# Patient Record
Sex: Male | Born: 1965 | Race: White | Hispanic: No | Marital: Single | Smoking: Never smoker
Health system: Southern US, Community
[De-identification: ages and names within clinical notes are randomized; demographics above are authoritative.]

## PROBLEM LIST (undated history)

## (undated) DIAGNOSIS — H547 Unspecified visual loss: Secondary | ICD-10-CM

## (undated) DIAGNOSIS — H9190 Unspecified hearing loss, unspecified ear: Secondary | ICD-10-CM

## (undated) DIAGNOSIS — T148XXA Other injury of unspecified body region, initial encounter: Secondary | ICD-10-CM

## (undated) DIAGNOSIS — S069XAA Unspecified intracranial injury with loss of consciousness status unknown, initial encounter: Secondary | ICD-10-CM

## (undated) DIAGNOSIS — S069X9A Unspecified intracranial injury with loss of consciousness of unspecified duration, initial encounter: Secondary | ICD-10-CM

## (undated) HISTORY — PX: VASECTOMY: SHX75

## (undated) HISTORY — PX: JOINT REPLACEMENT: SHX530

## (undated) HISTORY — PX: APPENDECTOMY: SHX54

## (undated) HISTORY — PX: FRACTURE SURGERY: SHX138

---

## 1999-06-25 ENCOUNTER — Emergency Department (HOSPITAL_COMMUNITY): Admission: EM | Admit: 1999-06-25 | Discharge: 1999-06-25 | Payer: Self-pay | Admitting: Emergency Medicine

## 1999-06-25 ENCOUNTER — Encounter: Payer: Self-pay | Admitting: Emergency Medicine

## 1999-06-28 ENCOUNTER — Observation Stay (HOSPITAL_COMMUNITY): Admission: RE | Admit: 1999-06-28 | Discharge: 1999-06-29 | Payer: Self-pay | Admitting: Orthopedic Surgery

## 1999-06-28 ENCOUNTER — Encounter: Payer: Self-pay | Admitting: Orthopedic Surgery

## 1999-11-15 ENCOUNTER — Ambulatory Visit (HOSPITAL_COMMUNITY): Admission: RE | Admit: 1999-11-15 | Discharge: 1999-11-15 | Payer: Self-pay | Admitting: Orthopedic Surgery

## 2013-02-03 ENCOUNTER — Telehealth: Payer: Self-pay | Admitting: Radiology

## 2013-02-03 ENCOUNTER — Ambulatory Visit: Payer: BC Managed Care – PPO

## 2013-02-03 ENCOUNTER — Ambulatory Visit (INDEPENDENT_AMBULATORY_CARE_PROVIDER_SITE_OTHER): Payer: BC Managed Care – PPO | Admitting: Family Medicine

## 2013-02-03 VITALS — BP 116/86 | HR 96 | Temp 98.1°F | Resp 20 | Ht 68.5 in | Wt 225.0 lb

## 2013-02-03 DIAGNOSIS — J189 Pneumonia, unspecified organism: Secondary | ICD-10-CM

## 2013-02-03 DIAGNOSIS — R05 Cough: Secondary | ICD-10-CM

## 2013-02-03 DIAGNOSIS — R059 Cough, unspecified: Secondary | ICD-10-CM

## 2013-02-03 LAB — COMPREHENSIVE METABOLIC PANEL
ALT: 36 U/L (ref 0–53)
AST: 41 U/L — ABNORMAL HIGH (ref 0–37)
Albumin: 3.7 g/dL (ref 3.5–5.2)
Alkaline Phosphatase: 66 U/L (ref 39–117)
BUN: 8 mg/dL (ref 6–23)
CO2: 26 mEq/L (ref 19–32)
Calcium: 8.7 mg/dL (ref 8.4–10.5)
Chloride: 104 mEq/L (ref 96–112)
Creat: 0.92 mg/dL (ref 0.50–1.35)
Glucose, Bld: 84 mg/dL (ref 70–99)
Potassium: 3.8 mEq/L (ref 3.5–5.3)
Sodium: 140 mEq/L (ref 135–145)
Total Bilirubin: 0.4 mg/dL (ref 0.3–1.2)
Total Protein: 6.7 g/dL (ref 6.0–8.3)

## 2013-02-03 LAB — POCT CBC
Granulocyte percent: 64.5 %G (ref 37–80)
HCT, POC: 46.6 % (ref 43.5–53.7)
Hemoglobin: 14.8 g/dL (ref 14.1–18.1)
Lymph, poc: 2.1 (ref 0.6–3.4)
MCH, POC: 27.9 pg (ref 27–31.2)
MCHC: 31.8 g/dL (ref 31.8–35.4)
MCV: 87.8 fL (ref 80–97)
MID (cbc): 0.8 (ref 0–0.9)
MPV: 9.4 fL (ref 0–99.8)
POC Granulocyte: 5.2 (ref 2–6.9)
POC LYMPH PERCENT: 26 %L (ref 10–50)
POC MID %: 9.5 %M (ref 0–12)
Platelet Count, POC: 206 10*3/uL (ref 142–424)
RBC: 5.31 M/uL (ref 4.69–6.13)
RDW, POC: 14.1 %
WBC: 8.1 10*3/uL (ref 4.6–10.2)

## 2013-02-03 MED ORDER — LEVOFLOXACIN 500 MG PO TABS: 500.0000 mg | ORAL_TABLET | Freq: Every day | ORAL | Status: DC

## 2013-02-03 MED ORDER — PHENYLEPH-PROMETHAZINE-COD 5-6.25-10 MG/5ML PO SYRP: 5.0000 mL | ORAL_SOLUTION | Freq: Four times a day (QID) | ORAL | Status: DC | PRN

## 2013-02-03 NOTE — Patient Instructions (Addendum)

## 2013-02-03 NOTE — Progress Notes (Signed)
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  Horrible cough with chest pain.  Nonproductive.  Associated night sweats.  Nonsmoker, no asthma.  Exposed to second hand smoke with wife, who is also sick  Objective:  Acutely ill but not dyspneic.  Violent painful cough Chest:  Bibasilar rales with some expiratory wheezes HEENT: mild erythematous pharynx and slight erythematous TM's Neck:  Supple, no adenop Heart: regular about 100 bpm, no murmur Extremities:  No edema [facility] reading (PRIMARY) by  Dr. Milus Glazier.  CXR  Left air bronchogram suggestive of infiltrate Results for orders placed in visit on 02/03/13  POCT CBC      Result Value Range   WBC 8.1  4.6 - 10.2 K/uL   Lymph, poc 2.1  0.6 - 3.4   POC LYMPH PERCENT 26.0  10 - 50 %L   MID (cbc) 0.8  0 - 0.9   POC MID % 9.5  0 - 12 %M   POC Granulocyte 5.2  2 - 6.9   Granulocyte percent 64.5  37 - 80 %G   RBC 5.31  4.69 - 6.13 M/uL   Hemoglobin 14.8  14.1 - 18.1 g/dL   HCT, POC 82.9  56.2 - 53.7 %   MCV 87.8  80 - 97 fL   MCH, POC 27.9  27 - 31.2 pg   MCHC 31.8  31.8 - 35.4 g/dL   RDW, POC 13.0     Platelet Count, POC 206  142 - 424 K/uL   MPV 9.4  0 - 99.8 fL     Assessment  Pneumonia, LLL  Plan   Pneumonia - Plan: levofloxacin (LEVAQUIN) 500 MG tablet, Phenyleph-Promethazine-Cod 5-6.25-10 MG/5ML SYRP  Cough - Plan: POCT CBC, Comprehensive metabolic panel, DG Chest 2 View  Subjective:    Austin Zavala is a 47 y.o. male who presents for  evaluation of cough. Symptoms include: anorexia. Symptoms began 5 days ago, and have been gradually worsening since that time. Patient denies dyspnea. Treatment thus far includes: anti-tussive: somewhat effective. Past pulmonary history is significant for no history of pneumonia or bronchitis.  The following portions of the patient's history were reviewed and updated as appropriate: allergies, past family history, past medical  history, past social history, past surgical history and problem list.  Review of Systems Pertinent items are noted in HPI.   Objective:     BP 116/86  Pulse 96  Temp(Src) 98.1 F (36.7 C) (Oral)  Resp 20  Ht 5' 8.5" (1.74 m)  Wt 225 lb (102.059 kg)  BMI 33.71 kg/m2  SpO2 92%  General Appearance:    Alert, cooperative, no distress, appears stated age  Head:    Normocephalic, without obvious abnormality, atraumatic  Eyes:    PERRL, conjunctiva/corneas clear, EOM's intact, fundi    benign, both eyes       Ears:    Normal TM's and external ear canals, both ears  Nose:   Nares normal, septum midline, mucosa normal, no drainage    or sinus tenderness  Throat:   Lips, mucosa, and tongue normal; teeth and gums normal  Neck:   Supple, symmetrical, trachea midline, no adenopathy;       thyroid:  No enlargement/tenderness/nodules; no carotid   bruit or JVD  Back:     Symmetric, no curvature, ROM normal, no CVA tenderness  Lungs:     Bilateral ronchi, rales and some exp wheezes  Chest wall:  No tenderness or deformity  Heart:    Regular rate and rhythm, S1 and S2 normal, no murmur, rub   or gallop  Abdomen:     Soft, non-tender, bowel sounds active all four quadrants,    no masses, no organomegaly  Genitalia:    Rectal:    Extremities:   Extremities normal, atraumatic, no cyanosis or edema  Pulses:   2+ and symmetric all extremities  Skin:   Skin color, texture, turgor normal, no rashes or lesions  Lymph nodes:   Cervical, supraclavicular, and axillary nodes normal  Neurologic:   CNII-XII intact. Normal strength, sensation and reflexes      throughout     Assessment:    Pneumonia   Plan:    The diagnosis was discussed along with the usual course and treatment.

## 2013-02-03 NOTE — Telephone Encounter (Signed)
Call report from radiology, Chest Xray negative, no sign of pneumonia, to you FYI.

## 2015-03-16 ENCOUNTER — Ambulatory Visit (INDEPENDENT_AMBULATORY_CARE_PROVIDER_SITE_OTHER): Payer: BC Managed Care – PPO | Admitting: Urgent Care

## 2015-03-16 VITALS — BP 128/78 | HR 112 | Temp 99.9°F | Resp 20 | Ht 68.5 in | Wt 246.5 lb

## 2015-03-16 DIAGNOSIS — L03113 Cellulitis of right upper limb: Secondary | ICD-10-CM

## 2015-03-16 DIAGNOSIS — I891 Lymphangitis: Secondary | ICD-10-CM | POA: Diagnosis not present

## 2015-03-16 LAB — POCT CBC
Granulocyte percent: 85.2 %G — AB (ref 37–80)
HCT, POC: 43.5 % (ref 43.5–53.7)
Hemoglobin: 14.2 g/dL (ref 14.1–18.1)
Lymph, poc: 1.9 (ref 0.6–3.4)
MCH, POC: 28.5 pg (ref 27–31.2)
MCHC: 32.7 g/dL (ref 31.8–35.4)
MCV: 87.1 fL (ref 80–97)
MID (cbc): 0.9 (ref 0–0.9)
MPV: 8.4 fL (ref 0–99.8)
POC Granulocyte: 15.7 — AB (ref 2–6.9)
POC LYMPH PERCENT: 10.1 %L (ref 10–50)
POC MID %: 4.7 %M (ref 0–12)
Platelet Count, POC: 228 10*3/uL (ref 142–424)
RBC: 4.99 M/uL (ref 4.69–6.13)
RDW, POC: 15 %
WBC: 18.4 10*3/uL — AB (ref 4.6–10.2)

## 2015-03-16 MED ORDER — CEFTRIAXONE SODIUM 1 G IJ SOLR
1.0000 g | Freq: Once | INTRAMUSCULAR | Status: AC
  Administered 2015-03-16: 1 g via INTRAMUSCULAR

## 2015-03-16 MED ORDER — DOXYCYCLINE HYCLATE 100 MG PO CAPS: 100.0000 mg | ORAL_CAPSULE | Freq: Two times a day (BID) | ORAL | Status: DC

## 2015-03-16 NOTE — Progress Notes (Signed)
MRN: [id number] DOB: 04/27/1966  Subjective:   Austin Zavala is a 49 y.o. male presenting for chief complaint of Arm Swelling  Reports 2 day history of right forearm infection. Patient reports that initially this problem started out as a pimple-like lesion on his right forearm. He states that he picked at it and got it to open up, it drained some clear liquid and sometimes pus it was very minimal. Today, he woke up and felt pain over his right forearm, saw that it was red and somewhat swollen, also started to have subjective fever. Has not tried any medications for relief. Denies numbness, tingling, decreased range of motion, decreased sensation, nausea, vomiting, abdominal pain. Of note, patient reports that he has previously had cellulitis of his left arm that spread into his axilla. This occurred back in 2003, patient states that he took antibiotics for a while and eventually his infection resolved. He denies previous infection with MRSA. Denies any other aggravating or relieving factors, no other questions or concerns.  Chrissie NoaWilliam has a current medication list which includes the following prescription(s): phenyleph-promethazine-cod. He has No Known Allergies.  Chrissie NoaWilliam  has no past medical history on file. Also  has past surgical history that includes Appendectomy; Fracture surgery; Vasectomy; and Joint replacement.  ROS As in subjective.  Objective:   Vitals: BP 128/78 mmHg  Pulse 112  Temp(Src) 99.9 F (37.7 C) (Oral)  Resp 20  Ht 5' 8.5" (1.74 m)  Wt 246 lb 8 oz (111.812 kg)  BMI 36.93 kg/m2  SpO2 96%  Physical Exam  Constitutional: He is oriented to person, place, and time. He appears well-developed and well-nourished.  Cardiovascular:  Tachycardic.  Pulmonary/Chest: Effort normal.  Musculoskeletal: Normal range of motion.       Right elbow: He exhibits swelling. He exhibits normal range of motion, no effusion, no deformity and no laceration. Tenderness found.        Arms: Neurological: He is alert and oriented to person, place, and time. He has normal reflexes.  Skin: Skin is warm and dry. No rash noted. There is erythema (as depicted). No pallor.    Results for orders placed or performed in visit on 03/16/15 (from the past 24 hour(s))  POCT CBC     Status: Abnormal   Collection Time: 03/16/15  8:02 PM  Result Value Ref Range   WBC 18.4 (A) 4.6 - 10.2 K/uL   Lymph, poc 1.9 0.6 - 3.4   POC LYMPH PERCENT 10.1 10 - 50 %L   MID (cbc) 0.9 0 - 0.9   POC MID % 4.7 0 - 12 %M   POC Granulocyte 15.7 (A) 2 - 6.9   Granulocyte percent 85.2 (A) 37 - 80 %G   RBC 4.99 4.69 - 6.13 M/uL   Hemoglobin 14.2 14.1 - 18.1 g/dL   HCT, POC 27.743.5 82.443.5 - 53.7 %   MCV 87.1 80 - 97 fL   MCH, POC 28.5 27 - 31.2 pg   MCHC 32.7 31.8 - 35.4 g/dL   RDW, POC 23.515.0 %   Platelet Count, POC 228 142 - 424 K/uL   MPV 8.4 0 - 99.8 fL   Assessment and Plan :   1. Cellulitis of right upper extremity 2. Lymphangitis - IM Rocephin provided in clinic today, start doxycycline x10 days. Counseled patient on worsening signs of infection, outlined the area of cellulitis and advised patient to return to clinic on 03/18/2015 if there has been no improvement despite antibiotic treatment and/or systemic  signs of infection develop.  Wallis BambergMario Oseas Detty, PA-C [facility] [phone] 03/16/2015 7:28 PM

## 2015-03-16 NOTE — Patient Instructions (Signed)

## 2015-03-18 ENCOUNTER — Encounter: Payer: Self-pay | Admitting: Family Medicine

## 2015-03-18 ENCOUNTER — Ambulatory Visit (INDEPENDENT_AMBULATORY_CARE_PROVIDER_SITE_OTHER): Payer: BC Managed Care – PPO | Admitting: Family Medicine

## 2015-03-18 VITALS — BP 122/88 | HR 120 | Temp 98.1°F | Resp 16 | Ht 68.5 in | Wt 235.0 lb

## 2015-03-18 DIAGNOSIS — L03113 Cellulitis of right upper limb: Secondary | ICD-10-CM | POA: Diagnosis not present

## 2015-03-18 DIAGNOSIS — L02413 Cutaneous abscess of right upper limb: Secondary | ICD-10-CM | POA: Diagnosis not present

## 2015-03-18 LAB — POCT CBC
Granulocyte percent: 81.6 %G — AB (ref 37–80)
HEMATOCRIT: 45.7 % (ref 43.5–53.7)
Hemoglobin: 14.2 g/dL (ref 14.1–18.1)
LYMPH, POC: 2.1 (ref 0.6–3.4)
MCH: 27.6 pg (ref 27–31.2)
MCHC: 31.1 g/dL — AB (ref 31.8–35.4)
MCV: 88.9 fL (ref 80–97)
MID (CBC): 0.7 (ref 0–0.9)
MPV: 8.7 fL (ref 0–99.8)
PLATELET COUNT, POC: 229 10*3/uL (ref 142–424)
POC GRANULOCYTE: 12.4 — AB (ref 2–6.9)
POC LYMPH PERCENT: 14 %L (ref 10–50)
POC MID %: 4.4 %M (ref 0–12)
RBC: 5.13 M/uL (ref 4.69–6.13)
RDW, POC: 15.5 %
WBC: 15.2 10*3/uL — AB (ref 4.6–10.2)

## 2015-03-18 NOTE — Patient Instructions (Signed)
Warm compresses 4-5 times per day. Continue doxycycline for now. Follow up Sunday for recheck.  Return to the clinic or go to the nearest emergency room if any of your symptoms worsen or new symptoms occur.   Abscess An abscess is an infected area that contains a collection of pus and debris.It can occur in almost any part of the body. An abscess is also known as a furuncle or boil. CAUSES  An abscess occurs when tissue gets infected. This can occur from blockage of oil or sweat glands, infection of hair follicles, or a minor injury to the skin. As the body tries to fight the infection, pus collects in the area and creates pressure under the skin. This pressure causes pain. People with weakened immune systems have difficulty fighting infections and get certain abscesses more often.  SYMPTOMS Usually an abscess develops on the skin and becomes a painful mass that is red, warm, and tender. If the abscess forms under the skin, you may feel a moveable soft area under the skin. Some abscesses break open (rupture) on their own, but most will continue to get worse without care. The infection can spread deeper into the body and eventually into the bloodstream, causing you to feel ill.  DIAGNOSIS  Your caregiver will take your medical history and perform a physical exam. A sample of fluid may also be taken from the abscess to determine what is causing your infection. TREATMENT  Your caregiver may prescribe antibiotic medicines to fight the infection. However, taking antibiotics alone usually does not cure an abscess. Your caregiver may need to make a small cut (incision) in the abscess to drain the pus. In some cases, gauze is packed into the abscess to reduce pain and to continue draining the area. HOME CARE INSTRUCTIONS   Only take over-the-counter or prescription medicines for pain, discomfort, or fever as directed by your caregiver.  If you were prescribed antibiotics, take them as directed. Finish  them even if you start to feel better.  If gauze is used, follow your caregiver's directions for changing the gauze.  To avoid spreading the infection:  Keep your draining abscess covered with a bandage.  Wash your hands well.  Do not share personal care items, towels, or whirlpools with others.  Avoid skin contact with others.  Keep your skin and clothes clean around the abscess.  Keep all follow-up appointments as directed by your caregiver. SEEK MEDICAL CARE IF:   You have increased pain, swelling, redness, fluid drainage, or bleeding.  You have muscle aches, chills, or a general ill feeling.  You have a fever. MAKE SURE YOU:   Understand these instructions.  Will watch your condition.  Will get help right away if you are not doing well or get worse. Document Released: 08/01/2005 Document Revised: 04/22/2012 Document Reviewed: 01/04/2012 [facility] Patient Information 2015 [place], [place]. This information is not intended to replace advice given to you by your health care provider. Make sure you discuss any questions you have with your health care provider.

## 2015-03-18 NOTE — Progress Notes (Signed)
Procedure:  Consent obtained.  Local anesthesia with 1% lido.  Skin cleaned and #11 blade used to make a cm incision into the fluctuance of the abscess.  Purulent material expressed and sent for wound culture.  Wound cavity probed and 1/4 in plain packing placed.  Drsg applied.  Wound care d/w pt and he will RTC in 48h for a recheck.

## 2015-03-18 NOTE — Progress Notes (Addendum)
Subjective:  This chart was scribed for Meredith Staggers, MD by Elveria Rising, Medial Scribe. This patient was seen in room 13 and the patient's care was started at 8:24 AM.    Patient ID: Austin Zavala, male    DOB: Aug 19, 1966, 49 y.o.   MRN: [id number] Chief Complaint  Patient presents with  . Wound Check    Getting worse    HPI HPI Comments: Austin Zavala is a 49 y.o. male who [facility] the [facility] requesting re evaluation of cellulitis to right posterior forearm; initial encounter 5/11. Patient seen by Wallis Bamberg. Infection initially started as pimple like lesion on forearm. Outlined area noted indicated cellulitis limited to right forearm. At visit, patient given IM Rocephin, 1g and started on Doxycycline,  BID.  Today patient reports improvement of his erythema, but reports development of central firming and "knotting up" to his wound. Patient denies fever, but reports cold chills at night. Patient also reports nocturia and urinary frequency; he denies personal history of Diabetes. Patient reports treatment with warm compresses daily and denies complications with his antibiotic.   There are no active problems to display for this patient.  No past medical history on file. Past Surgical History  Procedure Laterality Date  . Appendectomy    . Fracture surgery    . Vasectomy    . Joint replacement     No Known Allergies Prior to Admission medications   Medication Sig Start Date End Date Taking? Authorizing Provider  doxycycline (VIBRAMYCIN) 100 MG capsule Take 1 capsule (100 mg total) by mouth 2 (two) times daily. 03/16/15   Wallis Bamberg, PA-C   History   Social History  . Marital Status: Single    Spouse Name: N/A  . Number of Children: N/A  . Years of Education: N/A   Occupational History  . Not on file.   Social History Main Topics  . Smoking status: Never Smoker   . Smokeless tobacco: Current User     Comment: Dip  . Alcohol Use: 0.0  oz/week    0 Standard drinks or equivalent per week  . Drug Use: No  . Sexual Activity: Yes    Birth Control/ Protection: Surgical   Other Topics Concern  . Not on file   Social History Narrative      Review of Systems  Constitutional: Positive for chills. Negative for fever.  Genitourinary: Positive for frequency. Negative for dysuria.  Skin: Positive for color change and wound.       Objective:   Physical Exam  Constitutional: He is oriented to person, place, and time. He appears well-developed and well-nourished. No distress.  HENT:  Head: Normocephalic and atraumatic.  Eyes: EOM are normal.  Neck: Neck supple. No tracheal deviation present.  Cardiovascular: Normal rate.   Pulmonary/Chest: Effort normal. No respiratory distress.  Musculoskeletal: Normal range of motion.  Neurological: He is alert and oriented to person, place, and time.  Skin: Skin is warm and dry. There is erythema.  Right forearm: area of induration to dorsal forearm measuring 5 x 9cm with central most aspect slightly fluctuant under scab. There is erythema extending approximately 2cm from previously drawn line on ulnar aspect of infection and extending 3-4cm from the under surface of the forearm.   Psychiatric: He has a normal mood and affect. His behavior is normal.  Nursing note and vitals reviewed.    Filed Vitals:   03/18/15 0818  BP: 122/88  Pulse: 120  Temp: 98.1  F (36.7 C)  Resp: 16  Height: 5' 8.5" (1.74 m)  Weight: 235 lb (106.595 kg)  SpO2: 96%   Results for orders placed or performed in visit on 03/18/15  POCT CBC  Result Value Ref Range   WBC 15.2 (A) 4.6 - 10.2 K/uL   Lymph, poc 2.1 0.6 - 3.4   POC LYMPH PERCENT 14.0 10 - 50 %L   MID (cbc) 0.7 0 - 0.9   POC MID % 4.4 0 - 12 %M   POC Granulocyte 12.4 (A) 2 - 6.9   Granulocyte percent 81.6 (A) 37 - 80 %G   RBC 5.13 4.69 - 6.13 M/uL   Hemoglobin 14.2 14.1 - 18.1 g/dL   HCT, POC 27.245.7 53.643.5 - 53.7 %   MCV 88.9 80 - 97 fL    MCH, POC 27.6 27 - 31.2 pg   MCHC 31.1 (A) 31.8 - 35.4 g/dL   RDW, POC 64.415.5 %   Platelet Count, POC 229 142 - 424 K/uL   MPV 8.7 0 - 99.8 fL       Assessment & Plan:  Austin GingerWilliam Zavala is a 49 y.o. male Cellulitis of arm, right - Plan: POCT CBC, Wound culture  Abscess of forearm, right - Plan: Wound culture    I and D performed today, wound cx obtained. Improved CBC and afebrile. Will continue doxycycline, warm compresses and recheck in 2 days. Sooner if worse - RTC precautions discussed.     No orders of the defined types were placed in this encounter.   Patient Instructions  Warm compresses 4-5 times per day. Continue doxycycline for now. Follow up Sunday for recheck.  Return to the clinic or go to the nearest emergency room if any of your symptoms worsen or new symptoms occur.   Abscess An abscess is an infected area that contains a collection of pus and debris.It can occur in almost any part of the body. An abscess is also known as a furuncle or boil. CAUSES  An abscess occurs when tissue gets infected. This can occur from blockage of oil or sweat glands, infection of hair follicles, or a minor injury to the skin. As the body tries to fight the infection, pus collects in the area and creates pressure under the skin. This pressure causes pain. People with weakened immune systems have difficulty fighting infections and get certain abscesses more often.  SYMPTOMS Usually an abscess develops on the skin and becomes a painful mass that is red, warm, and tender. If the abscess forms under the skin, you may feel a moveable soft area under the skin. Some abscesses break open (rupture) on their own, but most will continue to get worse without care. The infection can spread deeper into the body and eventually into the bloodstream, causing you to feel ill.  DIAGNOSIS  Your caregiver will take your medical history and perform a physical exam. A sample of fluid may also be taken from the abscess  to determine what is causing your infection. TREATMENT  Your caregiver may prescribe antibiotic medicines to fight the infection. However, taking antibiotics alone usually does not cure an abscess. Your caregiver may need to make a small cut (incision) in the abscess to drain the pus. In some cases, gauze is packed into the abscess to reduce pain and to continue draining the area. HOME CARE INSTRUCTIONS   Only take over-the-counter or prescription medicines for pain, discomfort, or fever as directed by your caregiver.  If you were prescribed antibiotics,  take them as directed. Finish them even if you start to feel better.  If gauze is used, follow your caregiver's directions for changing the gauze.  To avoid spreading the infection:  Keep your draining abscess covered with a bandage.  Wash your hands well.  Do not share personal care items, towels, or whirlpools with others.  Avoid skin contact with others.  Keep your skin and clothes clean around the abscess.  Keep all follow-up appointments as directed by your caregiver. SEEK MEDICAL CARE IF:   You have increased pain, swelling, redness, fluid drainage, or bleeding.  You have muscle aches, chills, or a general ill feeling.  You have a fever. MAKE SURE YOU:   Understand these instructions.  Will watch your condition.  Will get help right away if you are not doing well or get worse. Document Released: 08/01/2005 Document Revised: 04/22/2012 Document Reviewed: 01/04/2012 [facility] Patient Information 2015 [place]Care, [place]LL[place]. This information is not intended to replace advice given to you by your health care provider. Make sure you discuss any questions you have with your health care provider.     I personally performed the services described in this documentation, which was scribed in my presence. The recorded information has been reviewed and considered, and addended by me as needed.

## 2015-03-20 ENCOUNTER — Ambulatory Visit (INDEPENDENT_AMBULATORY_CARE_PROVIDER_SITE_OTHER): Payer: BC Managed Care – PPO | Admitting: Physician Assistant

## 2015-03-20 VITALS — BP 148/100 | HR 90 | Temp 98.2°F | Resp 18 | Wt 237.0 lb

## 2015-03-20 DIAGNOSIS — L02413 Cutaneous abscess of right upper limb: Secondary | ICD-10-CM

## 2015-03-20 DIAGNOSIS — L03113 Cellulitis of right upper limb: Secondary | ICD-10-CM

## 2015-03-20 LAB — WOUND CULTURE
GRAM STAIN: NONE SEEN
GRAM STAIN: NONE SEEN

## 2015-03-20 LAB — POCT CBC
Granulocyte percent: 73.6 %G (ref 37–80)
HCT, POC: 43.5 % (ref 43.5–53.7)
Hemoglobin: 14.5 g/dL (ref 14.1–18.1)
Lymph, poc: 2.4 (ref 0.6–3.4)
MCH: 28.7 pg (ref 27–31.2)
MCHC: 33.3 g/dL (ref 31.8–35.4)
MCV: 86 fL (ref 80–97)
MID (CBC): 0.3 (ref 0–0.9)
MPV: 8.7 fL (ref 0–99.8)
POC GRANULOCYTE: 7.4 — AB (ref 2–6.9)
POC LYMPH PERCENT: 23.8 %L (ref 10–50)
POC MID %: 2.6 % (ref 0–12)
Platelet Count, POC: 298 10*3/uL (ref 142–424)
RBC: 5.05 M/uL (ref 4.69–6.13)
RDW, POC: 14.3 %
WBC: 10 10*3/uL (ref 4.6–10.2)

## 2015-03-20 NOTE — Progress Notes (Signed)
03/20/2015 at 8:58 AM  Austin Zavala / DOB: 04/06/1966 / MRN: [id number]  The patient  does not have a problem list on file.  SUBJECTIVE  Chief complaint: Wound Check   Patient here for follow up wound care status post I&D 2 days ago.  Reports new swelling and tenderness 2 inches lateral to the original incision. Culture shows sensitivity to doxy and patient is compliant.   He  has no past medical history on file.    Medications reviewed and updated by myself where necessary, and exist elsewhere in the encounter.   Austin Zavala has No Known Allergies. He  reports that he has never smoked. He uses smokeless tobacco. He reports that he drinks alcohol. He reports that he does not use illicit drugs. He  reports that he currently engages in sexual activity. He reports using the following method of birth control/protection: Surgical. The patient  has past surgical history that includes Appendectomy; Fracture surgery; Vasectomy; and Joint replacement.  His family history is not on file.  Review of Systems  Constitutional: Negative for fever.  Gastrointestinal: Negative for nausea.  Skin: Negative for itching and rash.  Neurological: Negative for dizziness and headaches.    OBJECTIVE  His  weight is 237 lb (107.502 kg). His temperature is 98.2 F (36.8 C). His blood pressure is 148/100 and his pulse is 90. His respiration is 18 and oxygen saturation is 99%.  The patient's body mass index is 35.51 kg/(m^2).  Physical Exam  Constitutional: He appears well-developed and well-nourished.  Cardiovascular: Normal rate and regular rhythm.   Respiratory: Effort normal and breath sounds normal.  Skin: Skin is warm and dry. Rash noted. There is erythema. No pallor.  Psychiatric: He has a normal mood and affect.    Results for orders placed or performed in visit on 03/20/15 (from the past 24 hour(s))  POCT CBC     Status: Abnormal   Collection Time: 03/20/15  8:53 AM  Result Value Ref Range   WBC  10.0 4.6 - 10.2 K/uL   Lymph, poc 2.4 0.6 - 3.4   POC LYMPH PERCENT 23.8 10 - 50 %L   MID (cbc) 0.3 0 - 0.9   POC MID % 2.6 0 - 12 %M   POC Granulocyte 7.4 (A) 2 - 6.9   Granulocyte percent 73.6 37 - 80 %G   RBC 5.05 4.69 - 6.13 M/uL   Hemoglobin 14.5 14.1 - 18.1 g/dL   HCT, POC 91.443.5 78.243.5 - 53.7 %   MCV 86.0 80 - 97 fL   MCH, POC 28.7 27 - 31.2 pg   MCHC 33.3 31.8 - 35.4 g/dL   RDW, POC 95.614.3 %   Platelet Count, POC 298 142 - 424 K/uL   MPV 8.7 0 - 99.8 fL    ASSESSMENT & PLAN  Austin Zavala was seen today for wound check.  Diagnoses and all orders for this visit:  Cellulitis of arm, right: Patient with new swelling and erythema s/p I&D.  Culture and sensitivities show staph with good response to Doxy.  Patient is compliant with po abx therapy and is to continue.  Arm marked along area of induration today and he is to follow up in 24 hours for reassessment.  Orders: -     POCT CBC  Abscess of forearm, right Orders: -     POCT CBC    The patient was advised to call or come back to clinic if he does not see an improvement in  symptoms, or worsens with the above plan.   Deliah BostonMichael Clark, MHS, PA-C [facility] 03/20/2015 8:58 AM

## 2015-03-21 ENCOUNTER — Ambulatory Visit (INDEPENDENT_AMBULATORY_CARE_PROVIDER_SITE_OTHER): Payer: BC Managed Care – PPO | Admitting: Physician Assistant

## 2015-03-21 VITALS — BP 130/82 | HR 101 | Temp 98.1°F | Resp 16 | Ht 68.5 in | Wt 239.6 lb

## 2015-03-21 DIAGNOSIS — L03113 Cellulitis of right upper limb: Secondary | ICD-10-CM | POA: Diagnosis not present

## 2015-03-21 LAB — POCT CBC
Granulocyte percent: 77.7 %G (ref 37–80)
HCT, POC: 43.3 % — AB (ref 43.5–53.7)
Hemoglobin: 13.8 g/dL — AB (ref 14.1–18.1)
LYMPH, POC: 1.9 (ref 0.6–3.4)
MCH: 27.7 pg (ref 27–31.2)
MCHC: 31.8 g/dL (ref 31.8–35.4)
MCV: 87.3 fL (ref 80–97)
MID (CBC): 0.5 (ref 0–0.9)
MPV: 8.7 fL (ref 0–99.8)
PLATELET COUNT, POC: 275 10*3/uL (ref 142–424)
POC GRANULOCYTE: 4.96 (ref 2–6.9)
POC LYMPH PERCENT: 17.3 %L (ref 10–50)
POC MID %: 5 % (ref 0–12)
RBC: 4.96 M/uL (ref 4.69–6.13)
RDW, POC: 15 %
WBC: 10.9 10*3/uL — AB (ref 4.6–10.2)

## 2015-03-21 NOTE — Progress Notes (Signed)
03/21/2015 at 12:35 PM  Austin Zavala / DOB: 06/24/1966 / MRN: [id number]  The patient  does not have a problem list on file.  SUBJECTIVE  Chief complaint: Wound Check  Patient here for wound care s/p I&D roughly five days ago.  There was concern for worsening celulitis yesterday however, white count was down and culture showed sensitivity to doxy.  Patient compliant with medication and also using heat.    He  has no past medical history on file.    Medications reviewed and updated by myself where necessary, and exist elsewhere in the encounter.   Austin Zavala has No Known Allergies. He  reports that he has never smoked. He uses smokeless tobacco. He reports that he drinks alcohol. He reports that he does not use illicit drugs. He  reports that he currently engages in sexual activity. He reports using the following method of birth control/protection: Surgical. The patient  has past surgical history that includes Appendectomy; Fracture surgery; Vasectomy; and Joint replacement.  His family history is not on file.  Review of Systems  Constitutional: Negative for fever and chills.  Gastrointestinal: Negative for nausea.  Neurological: Negative for dizziness.    OBJECTIVE  His  height is 5' 8.5" (1.74 m) and weight is 239 lb 9.6 oz (108.682 kg). His oral temperature is 98.1 F (36.7 C). His blood pressure is 130/82 and his pulse is 101. His respiration is 16 and oxygen saturation is 96%.  The patient's body mass index is 35.9 kg/(m^2).  Physical Exam  Vitals reviewed. Constitutional: He is oriented to person, place, and time.  Respiratory: Effort normal.  Neurological: He is alert and oriented to person, place, and time.  Skin:       Results for orders placed or performed in visit on 03/21/15 (from the past 24 hour(s))  POCT CBC     Status: Abnormal   Collection Time: 03/21/15 12:33 PM  Result Value Ref Range   WBC 10.9 (A) 4.6 - 10.2 K/uL   Lymph, poc 1.9 0.6 - 3.4   POC LYMPH  PERCENT 17.3 10 - 50 %L   MID (cbc) 0.5 0 - 0.9   POC MID % 5.0 0 - 12 %M   POC Granulocyte 4.96 2 - 6.9   Granulocyte percent 77.7 37 - 80 %G   RBC 4.96 4.69 - 6.13 M/uL   Hemoglobin 13.8 (A) 14.1 - 18.1 g/dL   HCT, POC 40.943.3 (A) 81.143.5 - 53.7 %   MCV 87.3 80 - 97 fL   MCH, POC 27.7 27 - 31.2 pg   MCHC 31.8 31.8 - 35.4 g/dL   RDW, POC 91.415.0 %   Platelet Count, POC 275 142 - 424 K/uL   MPV 8.7 0 - 99.8 fL    ASSESSMENT & PLAN  Austin Zavala was seen today for wound check.  Diagnoses and all orders for this visit:  Cellulitis of arm, right: Patient progressing in a positive direction.  He is to f/u in 48 hours or sooner if he worsens.  He is to continue abx therapy and thermotherapy. Absolute white count up however absolute granulocyte count down and he feels well.  He is to return for follow up in 48 hours.  Orders: -     POCT CBC    The patient was advised to call or come back to clinic if he does not see an improvement in symptoms, or worsens with the above plan.   Deliah BostonMichael Clark, MHS, PA-C [facility] 03/21/2015 12:35 PM

## 2015-03-23 ENCOUNTER — Ambulatory Visit (INDEPENDENT_AMBULATORY_CARE_PROVIDER_SITE_OTHER): Payer: BC Managed Care – PPO | Admitting: Physician Assistant

## 2015-03-23 ENCOUNTER — Ambulatory Visit (INDEPENDENT_AMBULATORY_CARE_PROVIDER_SITE_OTHER): Payer: BC Managed Care – PPO

## 2015-03-23 VITALS — BP 132/82 | HR 95 | Temp 98.1°F | Resp 16 | Ht 68.5 in

## 2015-03-23 DIAGNOSIS — L02413 Cutaneous abscess of right upper limb: Secondary | ICD-10-CM | POA: Diagnosis not present

## 2015-03-23 NOTE — Progress Notes (Signed)
   03/23/2015 at 6:05 PM  Secundino GingerWilliam Marschke / DOB: 04/06/1966 / MRN: [id number]  The patient  does not have a problem list on file.  SUBJECTIVE  Chief complaint: Wound Check  Patient here for wound care s/p I&D roughly five days ago.  There was concern for worsening celulitis yesterday however, white count was down and culture showed sensitivity to doxy.  Patient compliant with medication and also using heat.    He  has no past medical history on file.    Medications reviewed and updated by myself where necessary, and exist elsewhere in the encounter.   [name] has No Known Allergies. He  reports that he has never smoked. He uses smokeless tobacco. He reports that he drinks alcohol. He reports that he does not use illicit drugs. He  reports that he currently engages in sexual activity. He reports using the following method of birth control/protection: Surgical. The patient  has past surgical history that includes Appendectomy; Fracture surgery; Vasectomy; and Joint replacement.  His family history is not on file.  Review of Systems  Gastrointestinal: Negative for nausea.  Skin: Negative for itching.  Neurological: Negative for dizziness.    OBJECTIVE  His  height is 5' 8.5" (1.74 m). His oral temperature is 98.1 F (36.7 C). His blood pressure is 132/82 and his pulse is 95. His respiration is 16 and oxygen saturation is 96%.  The patient's body mass index is unknown because there is no weight on file.  Physical Exam  Vitals reviewed. Constitutional: He is oriented to person, place, and time.  Respiratory: Effort normal.  Neurological: He is alert and oriented to person, place, and time.  Skin:       No results found for this or any previous visit (from the past 24 hour(s)).  ASSESSMENT & PLAN  Chrissie NoaWilliam was seen today for wound check.  Diagnoses and all orders for this visit:  Cellulitis of arm, right: Patient progressing in a positive direction.  He is to f/u in 48 hours or  sooner if he worsens.  He is to continue abx therapy and thermotherapy. Absolute white count up however absolute granulocyte count down and he feels well.  He is to return for follow up in 48 hours.  Orders: -     POCT CBC    The patient was advised to call or come back to clinic if he does not see an improvement in symptoms, or worsens with the above plan.   Deliah BostonMichael Clark, MHS, PA-C [facility] 03/23/2015 6:05 PM       Patient ID: Secundino GingerWilliam Holle, male   DOB: 10/04/1966, [age] y.o.   MRN: [id number]

## 2015-03-23 NOTE — Progress Notes (Signed)
Blood pressure was 148/100 and should've been addressed.

## 2015-03-25 ENCOUNTER — Ambulatory Visit (INDEPENDENT_AMBULATORY_CARE_PROVIDER_SITE_OTHER): Payer: BC Managed Care – PPO

## 2015-03-25 ENCOUNTER — Ambulatory Visit (INDEPENDENT_AMBULATORY_CARE_PROVIDER_SITE_OTHER): Payer: BC Managed Care – PPO | Admitting: Physician Assistant

## 2015-03-25 VITALS — BP 136/90 | HR 104 | Temp 98.8°F | Resp 18 | Ht 68.5 in | Wt 239.0 lb

## 2015-03-25 DIAGNOSIS — R062 Wheezing: Secondary | ICD-10-CM | POA: Diagnosis not present

## 2015-03-25 DIAGNOSIS — R0600 Dyspnea, unspecified: Secondary | ICD-10-CM

## 2015-03-25 DIAGNOSIS — L02413 Cutaneous abscess of right upper limb: Secondary | ICD-10-CM

## 2015-03-25 DIAGNOSIS — Z836 Family history of other diseases of the respiratory system: Secondary | ICD-10-CM

## 2015-03-25 DIAGNOSIS — R05 Cough: Secondary | ICD-10-CM | POA: Diagnosis not present

## 2015-03-25 DIAGNOSIS — R059 Cough, unspecified: Secondary | ICD-10-CM

## 2015-03-25 MED ORDER — BECLOMETHASONE DIPROPIONATE 80 MCG/ACT IN AERS: 2.0000 | INHALATION_SPRAY | Freq: Two times a day (BID) | RESPIRATORY_TRACT | Status: DC

## 2015-03-25 MED ORDER — ALBUTEROL SULFATE (2.5 MG/3ML) 0.083% IN NEBU
2.5000 mg | INHALATION_SOLUTION | Freq: Once | RESPIRATORY_TRACT | Status: AC
  Administered 2015-03-25: 2.5 mg via RESPIRATORY_TRACT

## 2015-03-25 MED ORDER — BENZONATATE 100 MG PO CAPS: 100.0000 mg | ORAL_CAPSULE | Freq: Three times a day (TID) | ORAL | Status: DC | PRN

## 2015-03-25 MED ORDER — ALBUTEROL SULFATE HFA 108 (90 BASE) MCG/ACT IN AERS: 2.0000 | INHALATION_SPRAY | Freq: Four times a day (QID) | RESPIRATORY_TRACT | Status: DC | PRN

## 2015-03-25 MED ORDER — HYDROCOD POLST-CPM POLST ER 10-8 MG/5ML PO SUER: 5.0000 mL | Freq: Every evening | ORAL | Status: DC | PRN

## 2015-03-25 MED ORDER — IPRATROPIUM BROMIDE 0.02 % IN SOLN
0.5000 mg | Freq: Once | RESPIRATORY_TRACT | Status: AC
  Administered 2015-03-25: 0.5 mg via RESPIRATORY_TRACT
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
## 2015-03-25 NOTE — Patient Instructions (Signed)
Please keep wound covered until you start to see that pink fleshy colored tissue start to form at the wound.  Wash with soap and water twice daily, and extra if it becomes soiled.  We will see you on Friday.  Please return sooner if you notice increased redness, swelling, nausea, fever, drainage.  It is healing well. You will get contact for scheduling regarding pulmonology. I have included two medications at this time to help with breathing. Please take as prescribed.   Austin BostonMichael Zavala will be 8:30am-8:00pm.

## 2015-03-26 MED ORDER — SULFAMETHOXAZOLE-TRIMETHOPRIM 800-160 MG PO TABS: 1.0000 | ORAL_TABLET | Freq: Two times a day (BID) | ORAL | Status: DC

## 2015-10-03 ENCOUNTER — Encounter: Payer: Self-pay | Admitting: Internal Medicine

## 2016-11-15 IMAGING — CR DG CHEST 2V
2 series · 2 of 2 positions shown · non-contrast
Comparison: 02/03/2013

CLINICAL DATA: Acute shortness of breath and cough

EXAM:
CHEST  2 VIEW

[PA]
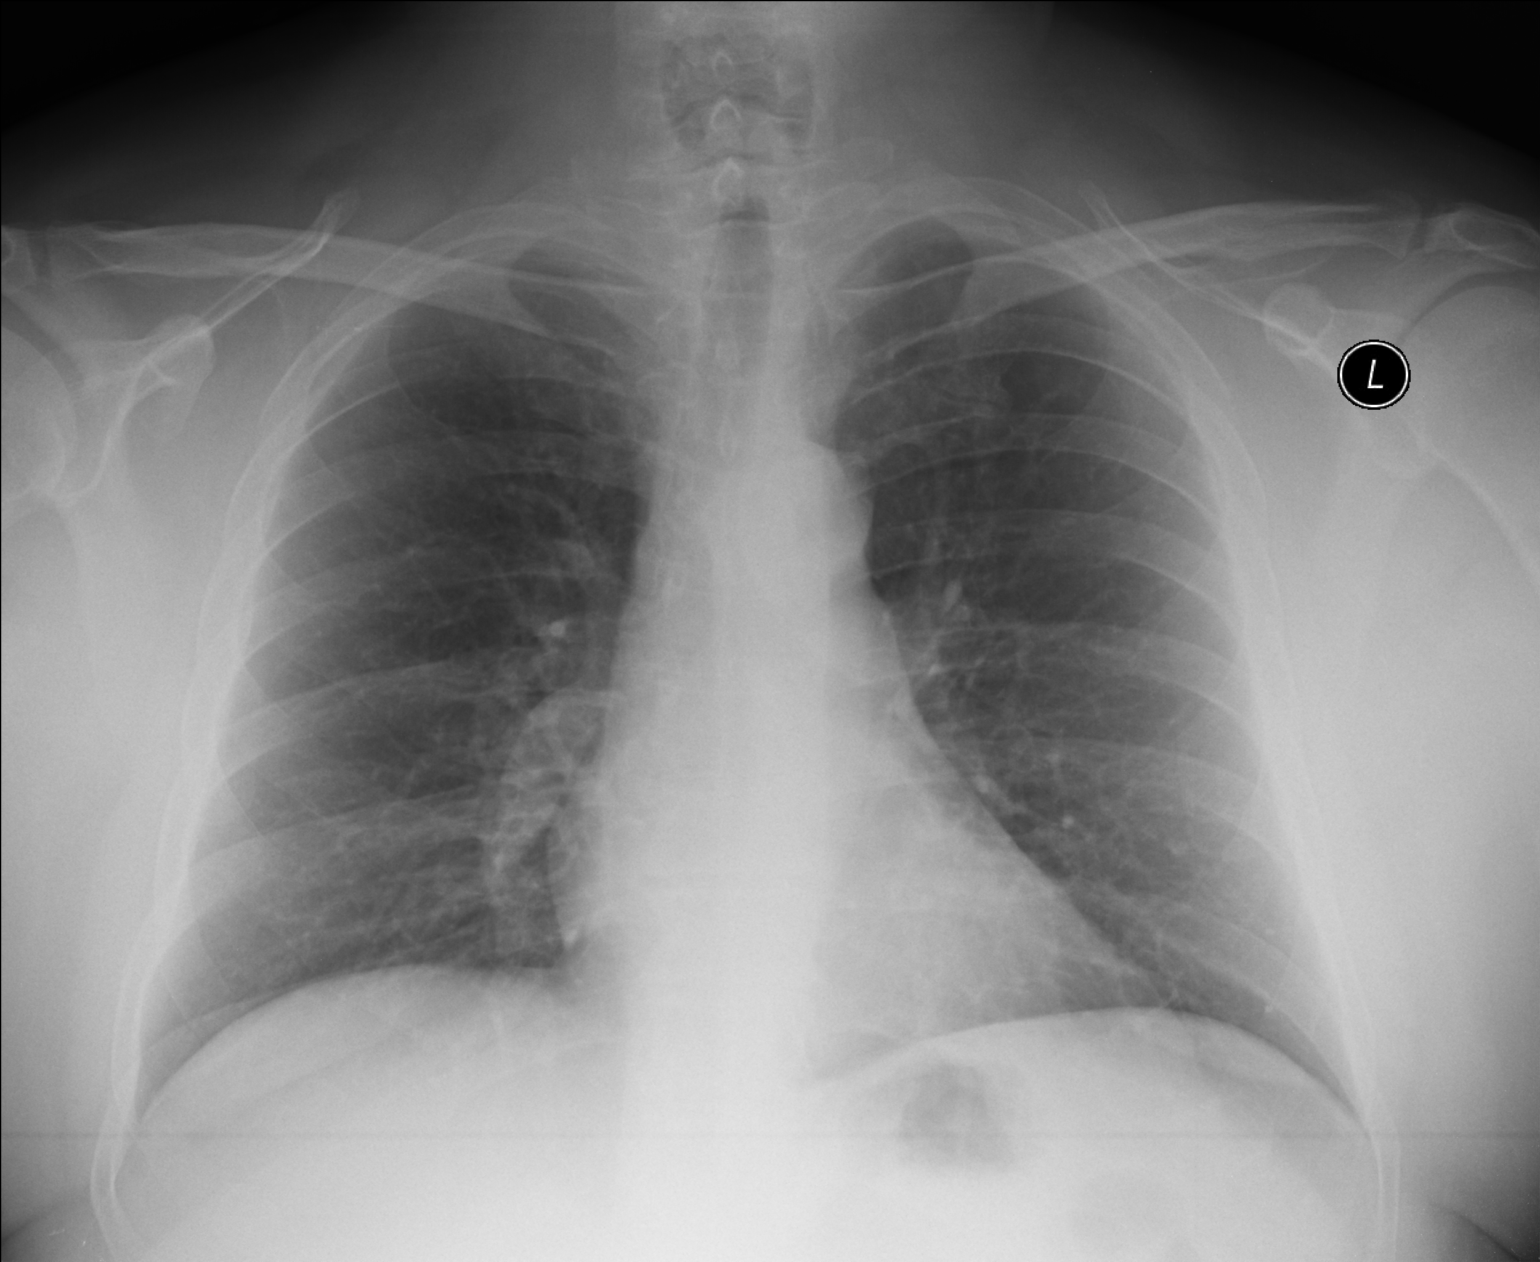

[lateral]
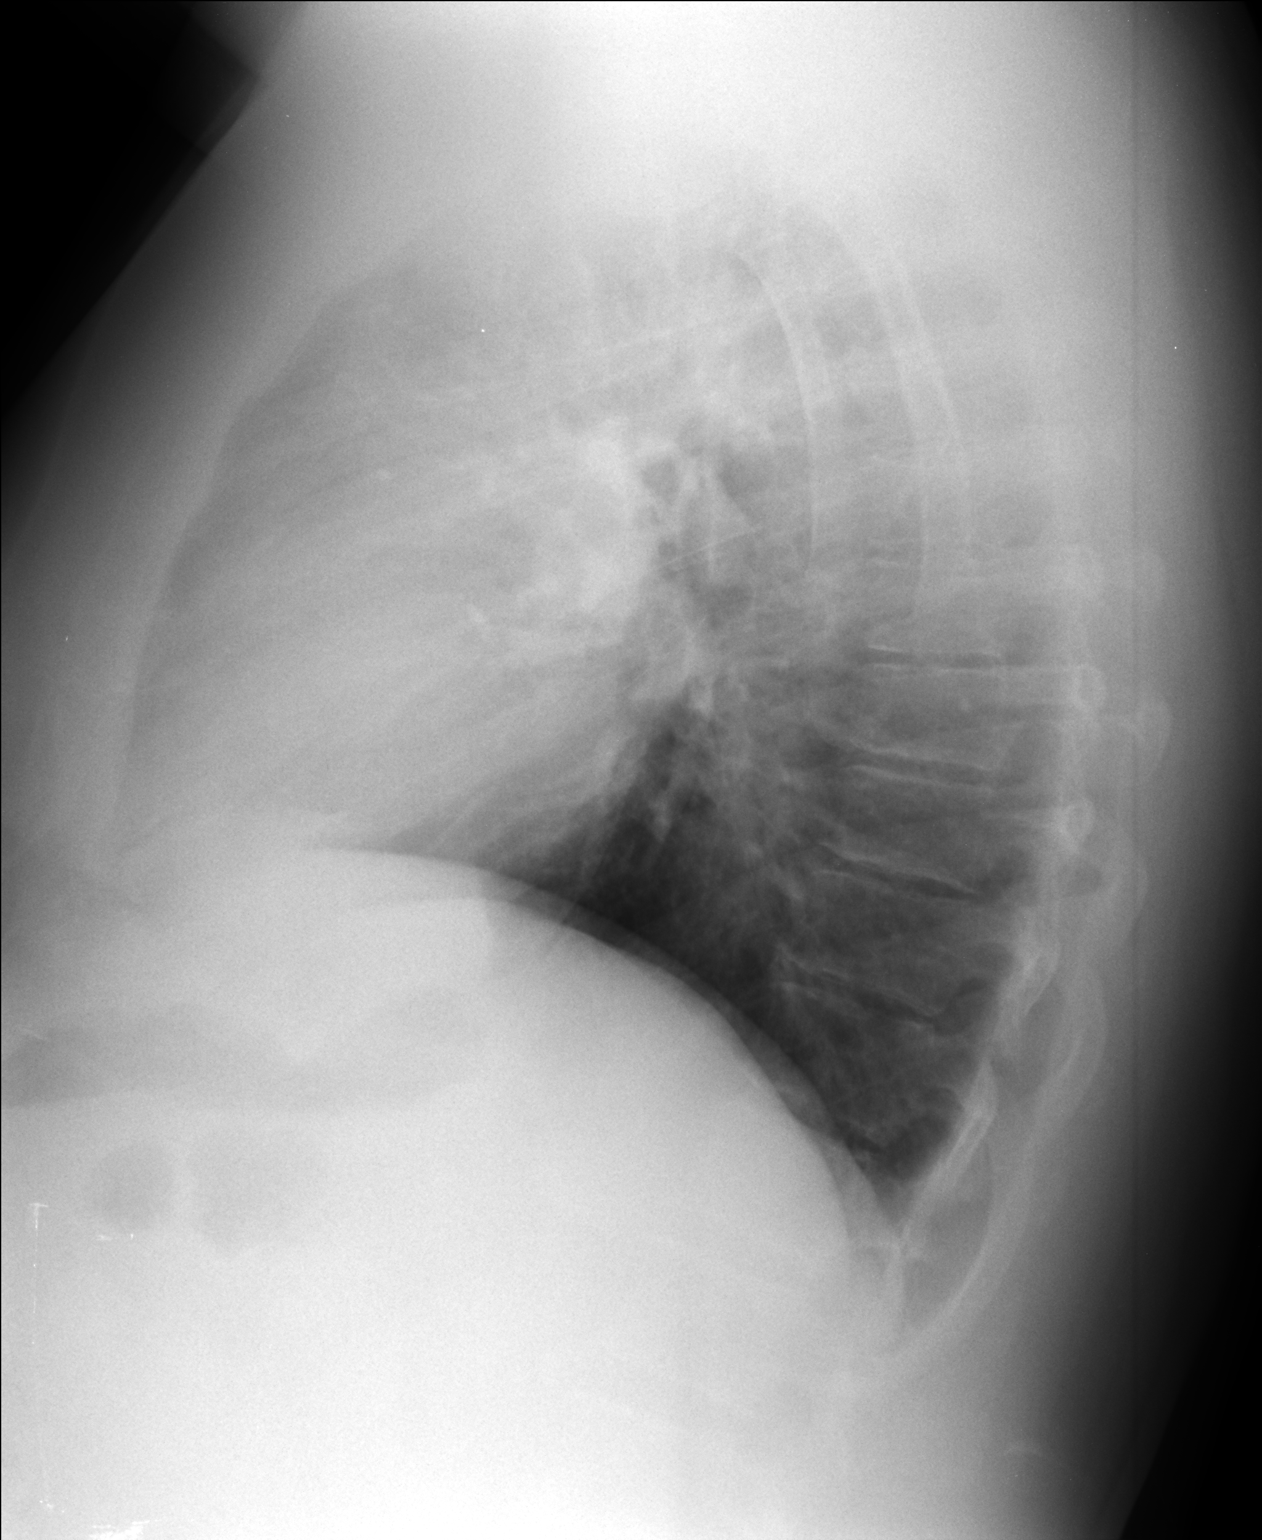

[2 of 2 positions shown; findings below may reference images not displayed]

FINDINGS: The heart size and mediastinal contours are within normal limits.
Both lungs are clear. The visualized skeletal structures are
unremarkable.
IMPRESSION: No active cardiopulmonary disease.

## 2017-08-27 ENCOUNTER — Ambulatory Visit: Payer: BC Managed Care – PPO | Attending: Physical Medicine & Rehabilitation | Admitting: Occupational Therapy

## 2017-08-27 ENCOUNTER — Encounter: Payer: Self-pay | Admitting: Occupational Therapy

## 2017-08-27 DIAGNOSIS — R278 Other lack of coordination: Secondary | ICD-10-CM | POA: Insufficient documentation

## 2017-08-27 DIAGNOSIS — R41844 Frontal lobe and executive function deficit: Secondary | ICD-10-CM | POA: Diagnosis present

## 2017-08-27 DIAGNOSIS — M6281 Muscle weakness (generalized): Secondary | ICD-10-CM | POA: Diagnosis present

## 2017-08-27 DIAGNOSIS — M25622 Stiffness of left elbow, not elsewhere classified: Secondary | ICD-10-CM | POA: Insufficient documentation

## 2017-08-27 DIAGNOSIS — R2681 Unsteadiness on feet: Secondary | ICD-10-CM | POA: Insufficient documentation

## 2017-08-27 DIAGNOSIS — R1312 Dysphagia, oropharyngeal phase: Secondary | ICD-10-CM | POA: Insufficient documentation

## 2017-08-27 DIAGNOSIS — R41841 Cognitive communication deficit: Secondary | ICD-10-CM | POA: Insufficient documentation
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
## 2017-08-28 ENCOUNTER — Ambulatory Visit: Payer: BC Managed Care – PPO | Admitting: Physical Therapy

## 2017-08-28 ENCOUNTER — Ambulatory Visit: Payer: BC Managed Care – PPO

## 2017-08-28 ENCOUNTER — Encounter: Payer: Self-pay | Admitting: Physical Therapy

## 2017-08-28 DIAGNOSIS — R41844 Frontal lobe and executive function deficit: Secondary | ICD-10-CM | POA: Diagnosis not present

## 2017-08-28 DIAGNOSIS — R1312 Dysphagia, oropharyngeal phase: Secondary | ICD-10-CM

## 2017-08-28 DIAGNOSIS — R2681 Unsteadiness on feet: Secondary | ICD-10-CM

## 2017-08-28 DIAGNOSIS — R41841 Cognitive communication deficit: Secondary | ICD-10-CM
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
## 2017-08-28 NOTE — Patient Instructions (Signed)
Dr. Cephus RicherAguilar - prescriptions:  1) Modified barium swallow - fax to [phone]    2) Hearing evaluation - fax to

## 2017-08-29 ENCOUNTER — Encounter: Payer: Self-pay | Admitting: Speech Pathology

## 2017-08-29 ENCOUNTER — Ambulatory Visit: Payer: Self-pay | Admitting: Rehabilitative and Restorative Service Providers"
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
## 2017-09-02 ENCOUNTER — Ambulatory Visit: Payer: BC Managed Care – PPO | Admitting: Speech Pathology

## 2017-09-02 DIAGNOSIS — R41844 Frontal lobe and executive function deficit: Secondary | ICD-10-CM | POA: Diagnosis not present

## 2017-09-02 DIAGNOSIS — R1312 Dysphagia, oropharyngeal phase: Secondary | ICD-10-CM

## 2017-09-02 DIAGNOSIS — R41841 Cognitive communication deficit: Secondary | ICD-10-CM

## 2017-09-02 NOTE — Therapy (Signed)
[facility] [address] [place], [place], [address] Phone: [phone]   Fax:  [phone]  Speech Language Pathology Treatment  Patient Details  Name: Austin Zavala MRN: [id number] Date of Birth: Mar 15, 1966 Referring Provider: Renato Gails, MD  Encounter Date: 09/02/2017      End of Session - 09/02/17 1309    Visit Number 2   Number of Visits 17   Date for SLP Re-Evaluation 11/01/17   SLP Start Time 1016   SLP Stop Time  1100   SLP Time Calculation (min) 44 min   Activity Tolerance Treatment limited secondary to agitation      No past medical history on file.  Past Surgical History:  Procedure Laterality Date  . APPENDECTOMY    . FRACTURE SURGERY    . JOINT REPLACEMENT    . VASECTOMY      There were no vitals filed for this visit.      Subjective Assessment - 09/02/17 1023    Subjective "He wouldn't even speak to me just now."   Patient is accompained by: Family member   Currently in Pain? No/denies               ADULT SLP TREATMENT - 09/02/17 1023      General Information   Behavior/Cognition Alert;Uncooperative;Requires cueing;Doesn't follow directions;Distractible;Impulsive     Treatment Provided   Treatment provided Cognitive-Linquistic     Pain Assessment   Pain Assessment No/denies pain     Cognitive-Linquistic Treatment   Treatment focused on Cognition   Skilled Treatment Patient ignored greeting from his evaluating clinician as he passed in the hallway on the way back to ST room. Pt then remarked that the clinician ignored him. SLP pointed out that the clinican had greeted him; pt disagreed. With cues from SLP pt acknowledged that he may have missed SLP greeting because he was focused on walking, but denies due to reduced attention. Reviewed pt's swallowing precautions and risks of aspiration; pt responded tangentially re: his upbringing and stated, "people know better than to  tell me what to do." Cart sorting and various card game tasks attempted, however pt required continuous cues to engage and grew verbally agitated, "I don't play cards." SLP modified tasks and pt remained engaged in remainder of session, approximately 15 minutes, in simple writing/sequencing tasks including creating a list of steps he would take when building a motorcycle. As task progressed, pt required mod cues for task persistence. Pt to finish task at home.       Assessment / Recommendations / Plan   Plan Continue with current plan of care     Progression Toward Goals   Progression toward goals --  limited by pt willingness to participate, engage in tasks          SLP Education - 09/02/17 1308    Education provided Yes   Education Details signs of aspiration pneumonia, swallowing precautions   Person(s) Educated Patient;Caregiver(s)   Methods Explanation   Comprehension Other (comment)  refused teaching          SLP Short Term Goals - 09/02/17 1311      SLP SHORT TERM GOAL #1   Title pt will participate in 90% of therapy tasks   Time 2   Period Weeks   Status On-going     SLP SHORT TERM GOAL #2   Title pt will demo intellectual awareness with 100% success with yes/no questions from SLP re: current deficits   Time  4   Period Weeks   Status On-going     SLP SHORT TERM GOAL #3   Title pt will demo sustained attention for 30 minutes for cognitive linguistic tasks   Time 4   Period Weeks   Status On-going     SLP SHORT TERM GOAL #4   Title pt will provide 3 overt s/s aspiration PNA with modified independence   Time 4   Period Weeks   Status On-going          SLP Long Term Goals - 09/02/17 1312      SLP LONG TERM GOAL #1   Title pt will demo emergent awareness 80% of the time in mod complex cognitve linguistic tasks over 3 sessions   Time 8   Period Weeks   Status On-going     SLP LONG TERM GOAL #2   Title pt will demo selective attention for 35-40 minutes  in min moisy environment during mod complex cognitive linguistic tasks   Time 8   Period Weeks   Status On-going     SLP LONG TERM GOAL #3   Title pt will tell SLP 3 swallow precautions with rare min A in 3 sessions   Time 8   Period Weeks   Status On-going          Plan - 09/02/17 1309    Clinical Impression Statement Pt presents with cognitive linguistic deficits in at least attention, awareness, memory, and executive function/reasoning/problem solving. Pt is loosely following dys II (chopped) diet (more like dys III/regular, reported) and is reportedly not adhering to liquid recommendation (nectar). Refused teaching today re: swallowing precautions and aspiration risks. Pt was verbally agressive and agitated with poor frustration tolerance today. He would benefit, at some point, from skilled ST addressing higher level cognitive linguistics and dysphagia therapy to return more to PLOF and for possible return to work at a later date. Please note pt's willingness to participate in therapy plays a role in his prognosis for recovery.    Speech Therapy Frequency 2x / week   Treatment/Interventions Pharyngeal strengthening exercises;Diet toleration management by SLP;Compensatory techniques;Internal/external aids;SLP instruction and feedback;Cognitive reorganization;Aspiration precaution training;Trials of upgraded texture/liquids;Cueing hierarchy;Functional tasks;Patient/family education   Potential to Achieve Goals Fair   Potential Considerations Ability to learn/carryover information;Cooperation/participation level   Consulted and Agree with Plan of Care Patient      Patient will benefit from skilled therapeutic intervention in order to improve the following deficits and impairments:   Cognitive communication deficit  Oropharyngeal dysphagia    Problem List There are no active problems to display for this patient.  Austin BatonMary Beth Constancia Geeting, MS, CCC-SLP Speech-Language Pathologist  Austin LindauMary Zavala  Austin Zavala 09/02/2017, 1:13 PM  [facility] [address] [place], [place], [address] Phone: [phone]   Fax:  [phone]   Name: Austin GingerWilliam Zavala MRN: [id number] Date of Birth: 05/25/1966

## 2017-09-04 ENCOUNTER — Ambulatory Visit: Payer: BC Managed Care – PPO | Admitting: Occupational Therapy

## 2017-09-04 ENCOUNTER — Encounter: Payer: Self-pay | Admitting: Occupational Therapy

## 2017-09-04 DIAGNOSIS — R2681 Unsteadiness on feet: Secondary | ICD-10-CM

## 2017-09-04 DIAGNOSIS — R41844 Frontal lobe and executive function deficit: Secondary | ICD-10-CM | POA: Diagnosis not present

## 2017-09-04 DIAGNOSIS — R278 Other lack of coordination: Secondary | ICD-10-CM

## 2017-09-04 DIAGNOSIS — M6281 Muscle weakness (generalized): Secondary | ICD-10-CM

## 2017-09-04 DIAGNOSIS — M25622 Stiffness of left elbow, not elsewhere classified: Secondary | ICD-10-CM
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
## 2017-09-12 ENCOUNTER — Encounter: Payer: Self-pay | Admitting: Occupational Therapy

## 2017-09-12 ENCOUNTER — Ambulatory Visit: Payer: BC Managed Care – PPO | Attending: Physical Medicine & Rehabilitation | Admitting: Occupational Therapy

## 2017-09-12 DIAGNOSIS — M25622 Stiffness of left elbow, not elsewhere classified: Secondary | ICD-10-CM | POA: Diagnosis present

## 2017-09-12 DIAGNOSIS — R41841 Cognitive communication deficit: Secondary | ICD-10-CM | POA: Insufficient documentation

## 2017-09-12 DIAGNOSIS — R2681 Unsteadiness on feet: Secondary | ICD-10-CM | POA: Insufficient documentation

## 2017-09-12 DIAGNOSIS — R1312 Dysphagia, oropharyngeal phase: Secondary | ICD-10-CM | POA: Diagnosis present

## 2017-09-12 DIAGNOSIS — R278 Other lack of coordination: Secondary | ICD-10-CM | POA: Diagnosis present

## 2017-09-12 DIAGNOSIS — R41844 Frontal lobe and executive function deficit: Secondary | ICD-10-CM | POA: Diagnosis present

## 2017-09-12 DIAGNOSIS — M6281 Muscle weakness (generalized): Secondary | ICD-10-CM | POA: Insufficient documentation

## 2017-09-12 NOTE — Patient Instructions (Signed)
Home Exercises for your Left arm:  Do these 3 times per day every day.  You can use heat before you do your exercises    1. Sit with your hand hanging over the edge of the table.  Work on bending your wrist down. Go as far as you can actively, then use your other hand and gentle push it a little further. HOLD for a count of 5.  Then return to starting position. Do 10 repetitions. Do this 3 times per day.  2. Same as #1, but bend your wrist back as far as you can. Then use your other hand to bend it a little further. HOLD for a count of 5. Then return to starting position. Do 10 and do this 3 times per day.  3.  Place forearm on the table. Try and get it as flat as possible, using your other hand to push it a little further. Then try and turn your palm up, without leaning your body. Go as far as you can, then use your other hand to push it a little further. Do 10. Do this 3 times per day.
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
## 2017-09-13 ENCOUNTER — Encounter: Payer: Self-pay | Admitting: Occupational Therapy

## 2017-09-13 ENCOUNTER — Ambulatory Visit: Payer: BC Managed Care – PPO | Admitting: Occupational Therapy

## 2017-09-13 DIAGNOSIS — R2681 Unsteadiness on feet: Secondary | ICD-10-CM | POA: Diagnosis not present

## 2017-09-13 DIAGNOSIS — M6281 Muscle weakness (generalized): Secondary | ICD-10-CM

## 2017-09-13 DIAGNOSIS — M25622 Stiffness of left elbow, not elsewhere classified: Secondary | ICD-10-CM

## 2017-09-13 DIAGNOSIS — R41844 Frontal lobe and executive function deficit: Secondary | ICD-10-CM

## 2017-09-13 DIAGNOSIS — R278 Other lack of coordination: Secondary | ICD-10-CM
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
## 2017-09-13 NOTE — Patient Instructions (Signed)
1. Grip Strengthening (Resistive Putty)   Yellow Putty   Squeeze putty using thumb and all fingers. Repeat _10___ times. Do __2__ sessions per day.   2. Roll putty into tube on table and pinch between thumb, index and middle finger.  Do 3 times.  Do 2 sessions per day.     Copyright  VHI. All rights reserved.

## 2017-09-16 ENCOUNTER — Ambulatory Visit: Payer: BC Managed Care – PPO | Admitting: Occupational Therapy

## 2017-09-16 ENCOUNTER — Ambulatory Visit: Payer: BC Managed Care – PPO

## 2017-09-16 ENCOUNTER — Encounter: Payer: Self-pay | Admitting: Occupational Therapy

## 2017-09-16 DIAGNOSIS — R278 Other lack of coordination: Secondary | ICD-10-CM

## 2017-09-16 DIAGNOSIS — R41841 Cognitive communication deficit: Secondary | ICD-10-CM

## 2017-09-16 DIAGNOSIS — M25622 Stiffness of left elbow, not elsewhere classified: Secondary | ICD-10-CM

## 2017-09-16 DIAGNOSIS — R1312 Dysphagia, oropharyngeal phase: Secondary | ICD-10-CM

## 2017-09-16 DIAGNOSIS — M6281 Muscle weakness (generalized): Secondary | ICD-10-CM

## 2017-09-16 DIAGNOSIS — R2681 Unsteadiness on feet: Secondary | ICD-10-CM | POA: Diagnosis not present

## 2017-09-16 DIAGNOSIS — R41844 Frontal lobe and executive function deficit: Secondary | ICD-10-CM

## 2017-09-16 NOTE — Therapy (Signed)
[facility] [address] [place], [place], [address] Phone: [phone]   Fax:  [phone]  Occupational Therapy Treatment  Patient Details  Name: Austin Zavala MRN: [id number] Date of Birth: 06/18/1966 Referring Provider: Dr. Renato GailsJohn Aguilar   Encounter Date: 09/16/2017  OT End of Session - 09/16/17 1343    Visit Number  5    Number of Visits  16    Date for OT Re-Evaluation  10/22/17    Authorization Type  [employer] manged State plan    Authorization Time Period  no auth, no visit limits    OT Start Time  1102    OT Stop Time  1145    OT Time Calculation (min)  43 min    Activity Tolerance  Patient tolerated treatment well       History reviewed. No pertinent past medical history.  Past Surgical History:  Procedure Laterality Date  . APPENDECTOMY    . FRACTURE SURGERY    . JOINT REPLACEMENT    . VASECTOMY      There were no vitals filed for this visit.  Subjective Assessment - 09/16/17 1106    Subjective   I am not going to lie I didn't do the putty    Patient is accompained by:  Family member wife    Currently in Pain?  Yes pt perservates on "mouth pain" - will monitor any additonal pain during session     Pain Score  9     Pain Location  Mouth    Pain Orientation  Right;Left    Pain Descriptors / Indicators  Numbness    Pain Type  Chronic pain    Pain Onset  More than a month ago    Pain Frequency  Constant    Aggravating Factors   I can't eat what I want     Pain Relieving Factors  nothing                   OT Treatments/Exercises (OP) - 09/16/17 0001      Exercises   Exercises  Hand      Wrist Exercises   Other wrist exercises  Wrist flexion, extension, supination and pronation all addressed for A/AAROM  10 reps each.  Followed up gentle strengthening using 1 pound weight focused on wrist flexion/extension.        Hand Exercises   Theraputty - Grip  Addresed grip strength using  yellow putty - pt needed max cues and redirection today as pt was mildly agitated.  Wife reports that she "stopped all those antidepresants as he was crying all the time."  Dicscussed with wife that low frustration tolerance and agitiated are normal sequelae of TBI and encouraged her to discuss these issues when she and pt meet with rehab MD in December. Wife verbalized understanding.  Wife reports that she feels pt's basic cognition are almost at baseline. Wife reports that pt "had a temper before but its worse now"  Provided education and support to wife.       RUE Fluidotherapy   Number Minutes Fluidotherapy  12 Minutes    RUE Fluidotherapy Location  Wrist;Forearm    Comments  To address stiffness and discomfort prior to exercise. Pt tolerated well.              OT Education - 09/16/17 1339    Education provided  Yes    Education Details  upgraded HEP for [employer]    Person(s) Educated  Patient;Spouse    Methods  Explanation;Demonstration;Verbal cues    Comprehension  Verbalized understanding;Returned demonstration       OT Short Term Goals - 09/16/17 1340      OT SHORT TERM GOAL #1   Title  Pt and wife will be mod I for HEP for LUE ROM and strength (pt with 10 lb precaution) - 09/24/2017    Status  On-going      OT SHORT TERM GOAL #2   Title  Assess 9 hole peg and set goal as appropriate     Status  On-going      OT SHORT TERM GOAL #3   Title  Pt will require no more than 2 general cues for morning routine    Status  On-going      OT SHORT TERM GOAL #4   Title  Pt will be mod I with UB and LB bathing    Status  On-going      OT SHORT TERM GOAL #5   Title  Once collar is removed, pt will be mod I with UB dressing and grooming    Status  On-going      OT SHORT TERM GOAL #6   Title  Pt will require no more than moderate cueing during therapy session to remain on familiar functional task    Status  On-going        OT Long Term Goals - 09/16/17 1340      OT LONG TERM  GOAL #1   Title  Pt and wife will be mod I with upgraded HEP for LUE (within precautions) - 10/22/2017    Status  On-going      OT LONG TERM GOAL #2   Title  Pt will be mod I for shower transfers    Status  On-going      OT LONG TERM GOAL #3   Title  Pt will be supervision for simple, familiar hot meal prep    Status  On-going      OT LONG TERM GOAL #4   Title  Pt will require no more than min vc's to remain on task for familiar functional activity for at least 15 minutes    Status  On-going      OT LONG TERM GOAL #5   Title  Pt will demonstate ability for basic organization for familiar functional tasks    Status  On-going      OT LONG TERM GOAL #6   Title  Pt will demonstrate ability to use LUE as non dominant during basic ADL and simple IADL tasks (once precautions are lifted)    Status  On-going      OT LONG TERM GOAL #7   Title  Pt will demonstrate improved AROM for wrist flexion/extension as well as pronation/supination in order to use LUE as gross assist during BADL's (flexion=30*, extension=7*, pronation=40*, supination= 50*    Status  On-going      OT LONG TERM GOAL #8   Title  Pt will demonstrate at least 10 pounds of L grip strength to assist with basic functional activities (baseline= 0)    Status  On-going            Plan - 09/16/17 1341    Clinical Impression Statement  Pt with slow progress toward goals. Pt to see neuro follow up this Thursday and pt/wife hopeful collar will be removed.     Rehab Potential  Fair    Current Impairments/barriers affecting progress:  given severity of cognitive/behavioral deficits.     OT Frequency  2x / week    OT Duration  8 weeks    OT Treatment/Interventions  Self-care/ADL training;Moist Heat;Fluidtherapy;Ultrasound;Therapeutic exercise;Neuromuscular education;DME and/or AE instruction;Passive range of motion;Manual Therapy;Functional Mobility Training;Splinting;Therapeutic activities;Cognitive  remediation/compensation;Patient/family education;Balance training    Plan  cotninue to address L wrist ROM, add light weighted stretch/strengthening, address grip strengthening within precautions (10 pounds), 9 hole peg test if appropriate, simple cooking task, incorporate cognition    Consulted and Agree with Plan of Care  Patient;Family member/caregiver    Family Member Consulted  wife       Patient will benefit from skilled therapeutic intervention in order to improve the following deficits and impairments:  Decreased activity tolerance, Decreased balance, Decreased cognition, Decreased coordination, Decreased safety awareness, Decreased range of motion, Decreased mobility, Decreased strength, Impaired UE functional use, Pain  Visit Diagnosis: Unsteadiness on feet  Muscle weakness (generalized)  Frontal lobe and executive function deficit  Stiffness of left upper arm joint  Other lack of coordination    Problem List There are no active problems to display for this patient.   Norton Pastelulaski, Karen Halliday, OTR/L 09/16/2017, 1:45 PM  [facility] [address] [place], [place], [address] Phone: [phone]   Fax:  [phone]  Name: Austin Zavala MRN: [id number] Date of Birth: 12/09/1965

## 2017-09-16 NOTE — Patient Instructions (Signed)
Pt to add light weight to wrist flexion/extension exercises at home (water bottle filled half way with water)

## 2017-09-16 NOTE — Therapy (Signed)
[facility] [address] [place], [place], [address] Phone: [phone]   Fax:  [phone]  Speech Language Pathology Treatment  Patient Details  Name: Austin Zavala MRN: [id number] Date of Birth: 09/09/1966 Referring Provider: Renato GailsAguilar, John, MD   Encounter Date: 09/16/2017  End of Session - 09/16/17 1121    Visit Number  3    Number of Visits  17    Date for SLP Re-Evaluation  11/01/17    SLP Start Time  1018    SLP Stop Time   1102    SLP Time Calculation (min)  44 min    Activity Tolerance  Treatment limited secondary to agitation;Patient tolerated treatment well       No past medical history on file.  Past Surgical History:  Procedure Laterality Date  . APPENDECTOMY    . FRACTURE SURGERY    . JOINT REPLACEMENT    . VASECTOMY      There were no vitals filed for this visit.  Subjective Assessment - 09/16/17 1020    Subjective  Pt greeted SLP in waiting room. Wants to cancel 09-30-17. SLP told pt it was up to him.     Currently in Pain?  Yes    Pain Score  9     Pain Location  Mouth    Pain Orientation  Right;Left    Pain Descriptors / Indicators  Numbness    Pain Type  Chronic pain    Pain Onset  More than a month ago    Pain Frequency  Constant    Aggravating Factors   the colder my nose gets the more it hurts    Pain Relieving Factors  nothing            ADULT SLP TREATMENT - 09/16/17 1026      General Information   Behavior/Cognition  Distractible;Requires cueing;Alert;Cooperative;Pleasant mood      Treatment Provided   Treatment provided  Cognitive-Linquistic      Cognitive-Linquistic Treatment   Treatment focused on  Cognition    Skilled Treatment  Pt not as verbose or verbally agitated today as during the evaluation. Pt spent approx 2- 2 1/2 hours yesterday driving with friends, finding parts for his motorcycle/s. Pt didn't do homework due to "my bike is more important than  anything I was gonna draw out for her."  Pt recalling previous bike trips with correct recall of directions to motel approx 2 1/2 hours away. Pt was 100% in making change verbally and practically <$1.00. Pt explained his [place] [employer] [place] paid time off schedule to SLP, correctly. Pt asked for SLP note stating he was in therapy and may not be able to make a court date. SLP suggested pt receive this note from MD, but pt stated he would like SLP note "for somewhere to start."       Assessment / Recommendations / Plan   Plan  Continue with current plan of care      Progression Toward Goals   Progression toward goals  Progressing toward goals         SLP Short Term Goals - 09/16/17 1127      SLP SHORT TERM GOAL #1   Title  pt will participate in 90% of therapy tasks over 2 sessions    Baseline  09-16-17    Time  1    Period  Weeks    Status  Revised      SLP SHORT TERM GOAL #2  Title  pt will demo intellectual awareness with 100% success with yes/no questions from SLP re: current deficits    Time  3    Period  Weeks    Status  On-going      SLP SHORT TERM GOAL #3   Title  pt will demo sustained attention for 30 minutes for cognitive linguistic tasks    Time  3    Period  Weeks    Status  On-going      SLP SHORT TERM GOAL #4   Title  pt will provide 3 overt s/s aspiration PNA with modified independence    Time  3    Period  Weeks    Status  On-going       SLP Long Term Goals - 09/16/17 1128      SLP LONG TERM GOAL #1   Title  pt will demo emergent awareness 80% of the time in mod complex cognitve linguistic tasks over 3 sessions    Time  7    Period  Weeks    Status  On-going      SLP LONG TERM GOAL #2   Title  pt will demo selective attention for 35-40 minutes in min moisy environment during mod complex cognitive linguistic tasks    Time  7    Period  Weeks    Status  On-going      SLP LONG TERM GOAL #3   Title  pt will tell SLP 3 swallow precautions with rare min A in 3  sessions    Time  7    Period  Weeks    Status  On-going       Plan - 09/16/17 1122    Clinical Impression Statement  Pt presents with what appears to be resolving cognitive linguistic deficits in attention, awareness, memory, and executive function/reasoning/problem solving. Pt less verbose/off topic, and less verbally agressive than previous session with this SLP (evaluation). Pt showed improved organization, problem solving/reasoning, and attention skills today. No overt s/s aspiration PNA today, and none reported. Pt  would cont to benefit, from skilled ST addressing higher level cognitive linguistics and dysphagia therapy to return more to PLOF and for possible return to work at a later date. 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 Please note pt's willingness to participate in therapy plays a role in his prognosis for recovery.    Speech Therapy Frequency  2x / week    Treatment/Interventions  Pharyngeal strengthening exercises;Diet toleration management by SLP;Compensatory techniques;Internal/external aids;SLP instruction and feedback;Cognitive reorganization;Aspiration precaution training;Trials of upgraded texture/liquids;Cueing hierarchy;Functional tasks;Patient/family education    Potential to Achieve Goals  Fair    Potential Considerations  Ability to learn/carryover information;Cooperation/participation level    Consulted and Agree with Plan of Care  Patient       Patient will benefit from skilled therapeutic intervention in order to improve the following deficits and impairments:   Cognitive communication deficit  Oropharyngeal dysphagia    Problem List There are no active problems to display for this patient.   [facility] Surgery CenterCHINKE,CARL ,MS, CCC-SLP  09/16/2017, 11:29 AM  [facility] [address] [place], [place], [address] Phone: [phone]   Fax:   [phone]   Name: Austin Zavala MRN: [id number] Date of Birth: 06/04/1966

## 2017-09-20 ENCOUNTER — Ambulatory Visit: Payer: BC Managed Care – PPO

## 2017-09-20 ENCOUNTER — Ambulatory Visit: Payer: BC Managed Care – PPO | Admitting: Occupational Therapy

## 2017-09-23 ENCOUNTER — Ambulatory Visit: Payer: BC Managed Care – PPO | Admitting: Speech Pathology

## 2017-09-23 ENCOUNTER — Ambulatory Visit: Payer: BC Managed Care – PPO | Admitting: Occupational Therapy

## 2017-09-23 ENCOUNTER — Encounter: Payer: Self-pay | Admitting: Occupational Therapy

## 2017-09-23 DIAGNOSIS — R2681 Unsteadiness on feet: Secondary | ICD-10-CM | POA: Diagnosis not present

## 2017-09-23 DIAGNOSIS — R278 Other lack of coordination: Secondary | ICD-10-CM

## 2017-09-23 DIAGNOSIS — R41844 Frontal lobe and executive function deficit: Secondary | ICD-10-CM

## 2017-09-23 DIAGNOSIS — M25622 Stiffness of left elbow, not elsewhere classified: Secondary | ICD-10-CM

## 2017-09-23 DIAGNOSIS — M6281 Muscle weakness (generalized): Secondary | ICD-10-CM

## 2017-09-23 DIAGNOSIS — R41841 Cognitive communication deficit: Secondary | ICD-10-CM

## 2017-09-23 DIAGNOSIS — R1312 Dysphagia, oropharyngeal phase: Secondary | ICD-10-CM

## 2017-09-23 NOTE — Therapy (Signed)
[facility] [address] [place], [place], [address] Phone: [phone]   Fax:  [phone]  Speech Language Pathology Treatment  Patient Details  Name: Austin Zavala MRN: [id number] Date of Birth: May 14, 1966 Referring Provider: Levada Schilling, MD   Encounter Date: 09/23/2017  End of Session - 09/23/17 1604    Visit Number  4    Number of Visits  17    Date for SLP Re-Evaluation  11/01/17    SLP Start Time  1316    SLP Stop Time   1402    SLP Time Calculation (min)  46 min    Activity Tolerance  Treatment limited secondary to agitation       No past medical history on file.  Past Surgical History:  Procedure Laterality Date  . APPENDECTOMY    . FRACTURE SURGERY    . JOINT REPLACEMENT    . VASECTOMY      There were no vitals filed for this visit.  Subjective Assessment - 09/23/17 1319    Subjective  "I kind of stumped him with change." pt, re: what he did with ST during last session    Patient is accompained by:  Family member    Currently in Pain?  Yes    Pain Score  9     Pain Location  Mouth    Pain Orientation  Right;Left    Pain Descriptors / Indicators  Numbness    Pain Type  Chronic pain            ADULT SLP TREATMENT - 09/23/17 1316      General Information   Behavior/Cognition  Impulsive;Uncooperative;Agitated    Patient Positioning  Upright in chair    Oral care provided  N/A      Treatment Provided   Treatment provided  Dysphagia;Cognitive-Linquistic      Dysphagia Treatment   Temperature Spikes Noted  No    Respiratory Status  Room air    Oral Cavity - Dentition  Adequate natural dentition    Treatment Methods  Patient/caregiver education    Patient observed directly with PO's  No    Other treatment/comments  Pt questioning whether he needs to complete repeat objective swallow evaluation (MBS). Pt has not been adhering to recommendations for nectar-thick liquids. SLP searched  Care Everywhere for report from pt's most recent swallow study and educated pt that his prior evaluation showed decreased sensation and silent aspiration of thin liquids. Educated pt that if he is silently aspirating he would not necessarily be aware without cough response. Encouraged pt to adhere to recommended swallow precautions until he is able to complete follow-up MBS. Pt initially argumentative however with extensive education and encouragement from his wife he is agreeable to follow-up MBS.       Cognitive-Linquistic Treatment   Treatment focused on  Cognition    Skilled Treatment  Pt was verbose and verbally agitated during today's session. Argumentative with SLP re: simple functional planning and organization tasks. Required usual mod A for participation, reducing impulsivity and attention to detail in simple internet search activities; 4 minutes into task pt refused remaining tasks (75%), stating, "I don't need to do this." SLP educated re: training skills vs. task. Pt perseverating on functionality and purpose of therapy; ST unable to redirect.      Assessment / Recommendations / Plan   Plan  Continue with current plan of care      Dysphagia Recommendations   Diet recommendations  Dysphagia 2 (  fine chop);Nectar-thick liquid      Progression Toward Goals   Progression toward goals  Not progressing toward goals (comment) verbal agitation, decreased participation       SLP Education - 09/23/17 1605    Education provided  Yes    Education Details  pt would not necessarily sense aspiration as MBS showed silent aspiration; purpose of therapy activities is to train skills, not specific tasks    Person(s) Educated  Patient;Spouse    Methods  Explanation;Demonstration;Verbal cues    Comprehension  Need further instruction refused teaching   refused teaching      SLP Short Term Goals - 09/23/17 1603      SLP [place] #1   Title  pt will participate in 90% of therapy tasks over  2 sessions    Baseline  09-16-17    Time  1    Status  Not Met      SLP SHORT TERM GOAL #2   Title  pt will demo intellectual awareness with 100% success with yes/no questions from SLP re: current deficits    Time  3    Period  Weeks    Status  On-going      SLP [place] #3   Title  pt will demo sustained attention for 30 minutes for cognitive linguistic tasks    Time  3    Period  Weeks    Status  On-going      SLP SHORT TERM GOAL #4   Title  pt will provide 3 overt s/s aspiration PNA with modified independence    Time  3    Period  Weeks    Status  On-going       SLP Long Term Goals - 09/23/17 1604      SLP LONG TERM GOAL #1   Title  pt will demo emergent awareness 80% of the time in mod complex cognitve linguistic tasks over 3 sessions    Time  7    Period  Weeks    Status  On-going      SLP LONG TERM GOAL #2   Title  pt will demo selective attention for 35-40 minutes in min moisy environment during mod complex cognitive linguistic tasks    Time  7    Period  Weeks    Status  On-going      SLP LONG TERM GOAL #3   Title  pt will tell SLP 3 swallow precautions with rare min A in 3 sessions    Time  7    Period  Weeks    Status  On-going       Plan - 09/23/17 1715    Clinical Impression Statement  Pt presents with cognitive linguistic deficits in attention, awareness, memory, and executive function/reasoning/problem solving. Pt was more verbose/off topic, and verbally aggressive than in previous session. Participation in therapy activities 25% today. No overt s/s aspiration PNA today, and none reported, however per SLP at [facility], pt requires instrumental examination prior to advancement of liquids due to silent aspiration. Extensive education with pt, wife today who are in agreement for f/u MBS. Pt  would cont to benefit, from skilled ST addressing higher level cognitive linguistics and dysphagia therapy to return more to PLOF and for possible return to work at a  later date. 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 Please note pt's willingness to participate in therapy plays a  role in his prognosis for recovery.    Speech Therapy Frequency  2x / week    Treatment/Interventions  Pharyngeal strengthening exercises;Diet toleration management by SLP;Compensatory techniques;Internal/external aids;SLP instruction and feedback;Cognitive reorganization;Aspiration precaution training;Trials of upgraded texture/liquids;Cueing hierarchy;Functional tasks;Patient/family education    Potential to Achieve Goals  Fair    Potential Considerations  Ability to learn/carryover information;Cooperation/participation level    Consulted and Agree with Plan of Care  Patient       Patient will benefit from skilled therapeutic intervention in order to improve the following deficits and impairments:   Oropharyngeal dysphagia - Plan: SLP eval and treat  Cognitive communication deficit    Problem List There are no active problems to display for this patient.  Deneise Lever, [place], CCC-SLP Speech-Language Pathologist  Aliene Altes 09/23/2017, 5:18 PM  [facility] [address] [place], [place], [address] Phone: [phone]   Fax:  [phone]   Name: Austin Zavala MRN: [id number] Date of Birth: 1966/08/24
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
## 2017-09-30 ENCOUNTER — Ambulatory Visit: Payer: BC Managed Care – PPO | Admitting: Occupational Therapy

## 2017-10-02 ENCOUNTER — Other Ambulatory Visit: Payer: Self-pay

## 2017-10-02 ENCOUNTER — Ambulatory Visit: Payer: BC Managed Care – PPO

## 2017-10-02 ENCOUNTER — Ambulatory Visit: Payer: BC Managed Care – PPO | Admitting: Occupational Therapy

## 2017-10-02 DIAGNOSIS — R1312 Dysphagia, oropharyngeal phase: Secondary | ICD-10-CM

## 2017-10-02 DIAGNOSIS — R2681 Unsteadiness on feet: Secondary | ICD-10-CM | POA: Diagnosis not present

## 2017-10-02 DIAGNOSIS — R41844 Frontal lobe and executive function deficit: Secondary | ICD-10-CM

## 2017-10-02 DIAGNOSIS — R278 Other lack of coordination: Secondary | ICD-10-CM

## 2017-10-02 DIAGNOSIS — M6281 Muscle weakness (generalized): Secondary | ICD-10-CM

## 2017-10-02 DIAGNOSIS — R41841 Cognitive communication deficit: Secondary | ICD-10-CM
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
## 2017-10-02 NOTE — Patient Instructions (Signed)
Write down questions for Dr. Riley KillSwartz. You may have difficulty recalling them if you don't. 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

## 2017-10-04 ENCOUNTER — Encounter: Payer: Self-pay | Admitting: Occupational Therapy

## 2017-10-04 ENCOUNTER — Ambulatory Visit: Payer: BC Managed Care – PPO | Admitting: Occupational Therapy

## 2017-10-04 ENCOUNTER — Other Ambulatory Visit: Payer: Self-pay

## 2017-10-04 ENCOUNTER — Ambulatory Visit: Payer: BC Managed Care – PPO

## 2017-10-04 DIAGNOSIS — R278 Other lack of coordination: Secondary | ICD-10-CM

## 2017-10-04 DIAGNOSIS — M6281 Muscle weakness (generalized): Secondary | ICD-10-CM

## 2017-10-04 DIAGNOSIS — R1312 Dysphagia, oropharyngeal phase: Secondary | ICD-10-CM

## 2017-10-04 DIAGNOSIS — R41841 Cognitive communication deficit: Secondary | ICD-10-CM

## 2017-10-04 DIAGNOSIS — M25622 Stiffness of left elbow, not elsewhere classified: Secondary | ICD-10-CM

## 2017-10-04 DIAGNOSIS — R2681 Unsteadiness on feet: Secondary | ICD-10-CM | POA: Diagnosis not present

## 2017-10-04 DIAGNOSIS — R41844 Frontal lobe and executive function deficit: Secondary | ICD-10-CM
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
## 2017-10-04 NOTE — Patient Instructions (Signed)
Continue to work on dual-tasking - you do one thing and you talking to Hollandindy at the same time.
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
## 2017-10-07 ENCOUNTER — Encounter: Payer: Self-pay | Admitting: Occupational Therapy

## 2017-10-07 ENCOUNTER — Ambulatory Visit: Payer: BC Managed Care – PPO | Attending: Physical Medicine & Rehabilitation | Admitting: Occupational Therapy

## 2017-10-07 DIAGNOSIS — R1312 Dysphagia, oropharyngeal phase: Secondary | ICD-10-CM | POA: Insufficient documentation

## 2017-10-07 DIAGNOSIS — R2681 Unsteadiness on feet: Secondary | ICD-10-CM | POA: Diagnosis present

## 2017-10-07 DIAGNOSIS — M6281 Muscle weakness (generalized): Secondary | ICD-10-CM | POA: Diagnosis present

## 2017-10-07 DIAGNOSIS — R278 Other lack of coordination: Secondary | ICD-10-CM | POA: Diagnosis present

## 2017-10-07 DIAGNOSIS — M25622 Stiffness of left elbow, not elsewhere classified: Secondary | ICD-10-CM | POA: Diagnosis present

## 2017-10-07 DIAGNOSIS — R41844 Frontal lobe and executive function deficit: Secondary | ICD-10-CM | POA: Diagnosis present

## 2017-10-07 DIAGNOSIS — R41841 Cognitive communication deficit: Secondary | ICD-10-CM | POA: Insufficient documentation
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
## 2017-10-08 ENCOUNTER — Encounter
Payer: BC Managed Care – PPO | Attending: Physical Medicine & Rehabilitation | Admitting: Physical Medicine & Rehabilitation

## 2017-10-08 ENCOUNTER — Other Ambulatory Visit: Payer: Self-pay

## 2017-10-08 ENCOUNTER — Encounter: Payer: Self-pay | Admitting: Physical Medicine & Rehabilitation

## 2017-10-08 ENCOUNTER — Ambulatory Visit: Payer: BC Managed Care – PPO

## 2017-10-08 VITALS — BP 132/91 | HR 81

## 2017-10-08 DIAGNOSIS — S0432XD Injury of trigeminal nerve, left side, subsequent encounter: Secondary | ICD-10-CM

## 2017-10-08 DIAGNOSIS — S0993XA Unspecified injury of face, initial encounter: Secondary | ICD-10-CM | POA: Insufficient documentation

## 2017-10-08 DIAGNOSIS — S069X0S Unspecified intracranial injury without loss of consciousness, sequela: Secondary | ICD-10-CM

## 2017-10-08 DIAGNOSIS — R42 Dizziness and giddiness: Secondary | ICD-10-CM | POA: Diagnosis not present

## 2017-10-08 DIAGNOSIS — H468 Other optic neuritis: Secondary | ICD-10-CM | POA: Diagnosis not present

## 2017-10-08 DIAGNOSIS — F068 Other specified mental disorders due to known physiological condition: Secondary | ICD-10-CM

## 2017-10-08 DIAGNOSIS — Z87820 Personal history of traumatic brain injury: Secondary | ICD-10-CM | POA: Insufficient documentation

## 2017-10-08 DIAGNOSIS — R51 Headache: Secondary | ICD-10-CM | POA: Diagnosis not present

## 2017-10-08 DIAGNOSIS — M6281 Muscle weakness (generalized): Secondary | ICD-10-CM | POA: Diagnosis not present

## 2017-10-08 DIAGNOSIS — M25512 Pain in left shoulder: Secondary | ICD-10-CM | POA: Diagnosis not present

## 2017-10-08 DIAGNOSIS — S6992XA Unspecified injury of left wrist, hand and finger(s), initial encounter: Secondary | ICD-10-CM | POA: Insufficient documentation

## 2017-10-08 DIAGNOSIS — R41841 Cognitive communication deficit: Secondary | ICD-10-CM

## 2017-10-08 DIAGNOSIS — R1312 Dysphagia, oropharyngeal phase: Secondary | ICD-10-CM

## 2017-10-08 DIAGNOSIS — M7522 Bicipital tendinitis, left shoulder: Secondary | ICD-10-CM | POA: Insufficient documentation

## 2017-10-08 MED ORDER — MELOXICAM 15 MG PO TABS: 15.0000 mg | ORAL_TABLET | Freq: Every day | ORAL | 3 refills | Status: DC

## 2017-10-08 MED ORDER — CARBAMAZEPINE 200 MG PO TABS: 200.0000 mg | ORAL_TABLET | Freq: Two times a day (BID) | ORAL | 2 refills | Status: DC

## 2017-10-08 NOTE — Therapy (Signed)
[facility] [address] [place], [place], [address] Phone: [phone]   Fax:  [phone]  Speech Language Pathology Treatment  Patient Details  Name: Austin Zavala MRN: [id number] Date of Birth: 05/02/1966 Referring Provider: Faith RogueSwartz, Zachary, MD; Renato GailsAguilar, John, MD   Encounter Date: 10/08/2017  End of Session - 10/08/17 0855    Visit Number  7    Number of Visits  17    Date for SLP Re-Evaluation  11/01/17    SLP Start Time  0804    SLP Stop Time   0848    SLP Time Calculation (min)  44 min    Activity Tolerance  Patient tolerated treatment well       History reviewed. No pertinent past medical history.  Past Surgical History:  Procedure Laterality Date  . APPENDECTOMY    . FRACTURE SURGERY    . JOINT REPLACEMENT    . VASECTOMY      There were no vitals filed for this visit.  Subjective Assessment - 10/08/17 0809    Subjective  Pt has Dr. Riley KillSwartz today. Pt reports he drove here today. SLP advised against this until Dr. authorization.    Currently in Pain?  Yes    Pain Score  9     Pain Location  Face    Pain Orientation  Left;Right    Pain Descriptors / Indicators  Aching    Pain Type  Acute pain    Pain Onset  More than a month ago    Pain Frequency  Intermittent    Aggravating Factors   cold    Pain Relieving Factors  heat            ADULT SLP TREATMENT - 10/08/17 0811      General Information   Behavior/Cognition  Alert;Cooperative;Pleasant mood      Treatment Provided   Treatment provided  Cognitive-Linquistic      Dysphagia Treatment   Temperature Spikes Noted  No    Other treatment/comments  No overt s/s aspiration PNA.      Cognitive-Linquistic Treatment   Treatment focused on  Cognition    Skilled Treatment  In a min complex auditory and min-mod complex written task simultaneously, pt demo'd divided attention 95% of the time, 100% accuracy in both tasks. In min-mod complex  auditory and written tasks, pt demonstrated divided attention 80% of the time and 90% accuracy (written) and 100% accuracy (auditory). Pt was encouraged to listen to SO re: his skills with divided attention being or not being at premorbid levels. SLP provided home tasks for pt to do for multitasking and reiterated the need for him to practice these tasks at home. Pt agreed this was an area that he needed to work on to bring skills up to premorbid levels. SLP cautioned pt on driving wihtout MD orders to do so.      Assessment / Recommendations / Plan   Plan  Continue with current plan of care      Progression Toward Goals   Progression toward goals  Progressing toward goals         SLP Short Term Goals - 10/08/17 0900      SLP SHORT TERM GOAL #1   Title  pt will participate in 90% of therapy tasks over 2 sessions    Status  Achieved      SLP SHORT TERM GOAL #2   Title  pt will demo intellectual awareness with 100% success with yes/no questions  from SLP re: current deficits    Status  Achieved      SLP SHORT TERM GOAL #3   Title  pt will demo sustained attention for 30 minutes for cognitive linguistic tasks    Status  Achieved      SLP SHORT TERM GOAL #4   Title  pt will provide 3 overt s/s aspiration PNA with modified independence    Time  2    Period  Weeks    Status  On-going       SLP Long Term Goals - 10/08/17 0901      SLP LONG TERM GOAL #1   Title  pt will demo emergent awareness 80% of the time in mod complex cognitve linguistic tasks over 3 sessions    Time  5    Period  Weeks    Status  On-going      SLP LONG TERM GOAL #2   Title  pt will demo selective attention for 35-40 minutes in min moisy environment during mod complex cognitive linguistic tasks    Time  5    Period  Weeks    Status  On-going      SLP LONG TERM GOAL #3   Title  pt will tell SLP 3 swallow precautions with rare min A in 3 sessions    Time  5    Period  Weeks    Status  On-going       SLP LONG TERM GOAL #4   Title  pt will demo divided attention between two min-mod complex cognitive-linguistic tasks 95% combined accuracy    Time  5    Period  Weeks    Status  New       Plan - 10/08/17 0857    Clinical Impression Statement  Pt presents with cont'd cognitive linguistic deficits in attention and short term memory. Attention skllls appear to be resolving. Pt adhered to topic WNL during session today. Pt parrticipated in therapy activities approx 100% today. Pt is also demonstrating improved awareness. No overt s/s aspiration PNA today, and none reported, however [facility] pt requires instrumental examination prior to advancement of liquids due to silent aspiration. SLP has requested outpatient MBS from Dr. Riley KillSwartz and awaits MD consideration. Pt would cont to benefit, from skilled ST addressing higher level cognitive linguistics and dysphagia therapy to return more to PLOF and for possible return to work at a later date.     Speech Therapy Frequency  2x / week    Treatment/Interventions  Pharyngeal strengthening exercises;Diet toleration management by SLP;Compensatory techniques;Internal/external aids;SLP instruction and feedback;Cognitive reorganization;Aspiration precaution training;Trials of upgraded texture/liquids;Cueing hierarchy;Functional tasks;Patient/family education    Potential to Achieve Goals  Fair    Potential Considerations  Ability to learn/carryover information;Cooperation/participation level    Consulted and Agree with Plan of Care  Patient       Patient will benefit from skilled therapeutic intervention in order to improve the following deficits and impairments:   Cognitive communication deficit  Oropharyngeal dysphagia    Problem List There are no active problems to display for this patient.   [facility],Malky Rudzinski ,MS, CCC-SLP  10/08/2017, 9:03 AM  [facility] [address] [place], [address] Phone: [phone]   Fax:  [phone]   Name: Austin Zavala MRN: [id number] Date of Birth: 05/25/1966
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
## 2017-10-08 NOTE — Patient Instructions (Signed)
  Please complete the assigned speech therapy homework prior to your next session and return it to the speech therapist at your next visit.  

## 2017-10-08 NOTE — Patient Instructions (Addendum)
PLEASE FEEL FREE TO CALL OUR OFFICE WITH ANY PROBLEMS OR QUESTIONS [phone])  HAVE A HAPPY HOLIDAYS!                     ^                  ^^                ^ ^ ^             ^ ^ ^ ^ ^           ^ ^ ^ ^ ^ ^ ^        ^ ^ ^ [place]^ ^ [place]^ ^ [place]^ ^      ^ [place]^ ^ [place]^ ^ [place]^ ^ [place]^ ^ ^ Marland Kitchen^                ^^^^                ^^^^                ^^^^     Take tegretol 200mg  at night for 5 days and then twice daily thereafter     Biceps Tendon Tendinitis (Proximal) and Tenosynovitis Rehab Ask your health care provider which exercises are safe for you. Do exercises exactly as told by your health care provider and adjust them as directed. It is normal to feel mild stretching, pulling, tightness, or discomfort as you do these exercises, but you should stop right away if you feel sudden pain or your pain gets worse.Do not begin these exercises until told by your health care provider. Stretching and range of motion exercises These exercises warm up your muscles and joints and improve the movement and flexibility of your arm and shoulder. These exercises also help to relieve pain and stiffness. Exercise A: Shoulder flexion  1. Stand facing a wall. Put your left / right hand on the wall. 2. Slide your left / right hand up the wall. Stop when you feel a stretch in your shoulder, or when you reach the angle that is recommended by your health care provider. ? Use your other hand to help raise your arm, if needed. ? As your hand gets higher, you may need to step closer to the wall. ? Avoid shrugging your shoulder while you raise your arm. To do this, keep your shoulder blade tucked down toward your spine. 3. Hold for __________ seconds. 4. Slowly return to the starting position. Use your other arm to help, if needed. Repeat __________ times. Complete this exercise __________ times a day. Exercise B: Posterior capsule stretch ( passive horizontal adduction) 1. Sit or stand and pull your left / right elbow across  your chest, toward your other shoulder. Stop when you feel a gentle stretch in the back of your shoulder and upper arm. ? Keep your arm at shoulder height. ? Keep your arm as close to your body as you comfortably can. 2. Hold for __________ seconds. 3. Slowly return to the starting position. Repeat __________ times. Complete this exercise __________ times a day. Strengthening exercises These exercises build strength and endurance in your arm and shoulder. Endurance is the ability to use your muscles for a long time, even after your muscles get tired. Exercise C: Elbow flexion, supinated  1. Sit on a stable chair without armrests, or stand. 2. If directed, hold a __________ weight in your left / right hand, or hold an  exercise band with both hands. Your palms should face up toward the ceiling at the starting position. 3. Bend your left / right elbow and move your hand up toward your shoulder. Keep your other arm straight down, in the starting position. 4. Slowly return to the starting position. Repeat __________ times. Complete this exercise __________ times a day. Exercise D: Scapular protraction, supine  1. Lie on your back on a firm surface. If directed, hold a __________ weight in your left / right hand. 2. Raise your left / right arm straight into the air so your hand is directly above your shoulder joint. 3. Push the weight into the air so your shoulder lifts off of the surface that you are lying on. Do not move your head, neck, or back. 4. Hold for __________ seconds. 5. Slowly return to the starting position. Let your muscles relax completely before you repeat this exercise. Repeat __________ times. Complete this exercise __________ times a day. Exercise E: Scapular retraction  1. Sit in a stable chair without armrests, or stand. 2. Secure an exercise band to a stable object in front of you so the band is at shoulder height. 3. Hold one end of the exercise band in each  hand. 4. Squeeze your shoulder blades together and move your elbows slightly behind you. Do not shrug your shoulders. 5. Hold for __________ seconds. 6. Slowly return to the starting position. Repeat __________ times. Complete this exercise __________ times a day. This information is not intended to replace advice given to you by your health care provider. Make sure you discuss any questions you have with your health care provider. Document Released: 10/22/2005 Document Revised: 06/28/2016 Document Reviewed: 09/30/2015 Elsevier Interactive Patient Education  [employer]2018 [employer] Inc.

## 2017-10-10 ENCOUNTER — Ambulatory Visit: Payer: BC Managed Care – PPO

## 2017-10-10 ENCOUNTER — Ambulatory Visit: Payer: BC Managed Care – PPO | Admitting: Occupational Therapy

## 2017-10-10 ENCOUNTER — Other Ambulatory Visit: Payer: Self-pay

## 2017-10-10 ENCOUNTER — Encounter: Payer: Self-pay | Admitting: Occupational Therapy

## 2017-10-10 DIAGNOSIS — M6281 Muscle weakness (generalized): Secondary | ICD-10-CM | POA: Diagnosis not present

## 2017-10-10 DIAGNOSIS — R1312 Dysphagia, oropharyngeal phase: Secondary | ICD-10-CM

## 2017-10-10 DIAGNOSIS — R41844 Frontal lobe and executive function deficit: Secondary | ICD-10-CM

## 2017-10-10 DIAGNOSIS — R278 Other lack of coordination: Secondary | ICD-10-CM

## 2017-10-10 DIAGNOSIS — M25622 Stiffness of left elbow, not elsewhere classified: Secondary | ICD-10-CM

## 2017-10-10 DIAGNOSIS — R41841 Cognitive communication deficit: Secondary | ICD-10-CM

## 2017-10-10 NOTE — Patient Instructions (Signed)
   Apps to help with attention and memory  - Peak  - Lumosity  - Brain Training
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
## 2017-10-10 NOTE — Therapy (Signed)
[facility] [address] [place], [place], [address] Phone: [phone]   Fax:  [phone]  Speech Language Pathology Treatment  Patient Details  Name: Austin Zavala MRN: [id number] Date of Birth: 06/13/1966 Referring Provider: Renato GailsAguilar, John, MD   Encounter Date: 10/10/2017  End of Session - 10/10/17 1000    Visit Number  8    Number of Visits  17    Date for SLP Re-Evaluation  11/01/17    SLP Start Time  0848    SLP Stop Time   0931    SLP Time Calculation (min)  43 min    Activity Tolerance  Patient tolerated treatment well       History reviewed. No pertinent past medical history.  Past Surgical History:  Procedure Laterality Date  . APPENDECTOMY    . FRACTURE SURGERY    . JOINT REPLACEMENT    . VASECTOMY      There were no vitals filed for this visit.         ADULT SLP TREATMENT - 10/10/17 0916      General Information   Behavior/Cognition  Alert;Cooperative;Pleasant mood slight agitation with map task due to need to study map      Treatment Provided   Treatment provided  Cognitive-Linquistic      Cognitive-Linquistic Treatment   Treatment focused on  Cognition    Skilled Treatment  SLP used min complex auditory and min-mod complex written task simultaneously, and pt demo'd divided attention 95% of the time, and 100% accuracy in both tasks. In min-mod complex auditory and written tasks, pt demonstrated divided attention 75% of the time and 100% accuracy (written) and 100% accuracy (auditory). Pt with min agitation when he had to study written task to asnwer questions. SLP cont encourage pt to perform divided attention tasks at home. SLP provided app suggestions for cognitive linguistic skills and asked pt to download. Pt with adeauate sustained attention during therapy today for >30 minutes. MD OK'd pt driving locally no more than 15 minutes drive time.       Assessment / Recommendations / Plan   Plan  Continue with current plan of care      Progression Toward Goals   Progression toward goals  Progressing toward goals       SLP Education - 10/10/17 1000    Education provided  Yes    Education Details  apps to help pt cognitive linguistics    Person(s) Educated  Patient    Methods  Explanation;Verbal cues    Comprehension  Verbalized understanding       SLP Short Term Goals - 10/10/17 1002      SLP SHORT TERM GOAL #1   Title  pt will participate in 90% of therapy tasks over 2 sessions    Status  Achieved      SLP SHORT TERM GOAL #2   Title  pt will demo intellectual awareness with 100% success with yes/no questions from SLP re: current deficits    Status  Achieved      SLP SHORT TERM GOAL #3   Title  pt will demo sustained attention for 30 minutes for cognitive linguistic tasks    Status  Achieved      SLP SHORT TERM GOAL #4   Title  pt will provide 3 overt s/s aspiration PNA with modified independence    Time  2    Period  Weeks    Status  On-going  SLP Long Term Goals - 10/10/17 1002      SLP LONG TERM GOAL #1   Title  pt will demo emergent awareness 80% of the time in mod complex cognitve linguistic tasks over 3 sessions    Baseline  10-10-17    Time  5    Period  Weeks    Status  On-going      SLP LONG TERM GOAL #2   Title  pt will demo selective attention for 35-40 minutes in min moisy environment during mod complex cognitive linguistic tasks    Time  5    Period  Weeks    Status  On-going      SLP LONG TERM GOAL #3   Title  pt will tell SLP 3 swallow precautions with rare min A in 3 sessions    Time  5    Period  Weeks    Status  On-going      SLP LONG TERM GOAL #4   Title  pt will demo divided attention between two min-mod complex cognitive-linguistic tasks 95% combined accuracy    Time  5    Period  Weeks    Status  New       Plan - 10/10/17 1000    Clinical Impression Statement  Pt presents with cont'd cognitive linguistic  deficits in attention and short term memory. Attention skllls appear to be resolving. Pt with 30 minutes adequate sustained attention during session today. [facility] examination prior to advancement of liquids due to silent aspiration. SLP has requested outpatient MBS from Dr. Riley KillSwartz and awaits MD consideration. Pt would cont to benefit, from skilled ST addressing higher level cognitive linguistics and dysphagia therapy to return more to PLOF and for possible return to work at a later date.     Speech Therapy Frequency  2x / week    Treatment/Interventions  Pharyngeal strengthening exercises;Diet toleration management by SLP;Compensatory techniques;Internal/external aids;SLP instruction and feedback;Cognitive reorganization;Aspiration precaution training;Trials of upgraded texture/liquids;Cueing hierarchy;Functional tasks;Patient/family education    Potential to Achieve Goals  Fair    Potential Considerations  Ability to learn/carryover information;Cooperation/participation level    Consulted and Agree with Plan of Care  Patient       Patient will benefit from skilled therapeutic intervention in order to improve the following deficits and impairments:   Cognitive communication deficit  Oropharyngeal dysphagia    Problem List Patient Active Problem List   Diagnosis Date Noted  . History of traumatic brain injury 10/08/2017  . 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 Left shoulder pain 10/08/2017    [facility]IN[facility],CARL ,MS, CCC-SLP  10/10/2017, 10:30 AM  [facility] [address] [place], [place], [address] Phone: [phone]   Fax:  [phone]   Name: Austin Zavala MRN: [id number] Date of Birth: 04/30/1966

## 2017-10-17 ENCOUNTER — Encounter: Payer: Self-pay | Admitting: Occupational Therapy

## 2017-10-17 ENCOUNTER — Ambulatory Visit: Payer: BC Managed Care – PPO | Admitting: Occupational Therapy

## 2017-10-17 DIAGNOSIS — M6281 Muscle weakness (generalized): Secondary | ICD-10-CM | POA: Diagnosis not present

## 2017-10-17 DIAGNOSIS — M25622 Stiffness of left elbow, not elsewhere classified: Secondary | ICD-10-CM

## 2017-10-17 DIAGNOSIS — R41844 Frontal lobe and executive function deficit: Secondary | ICD-10-CM

## 2017-10-17 DIAGNOSIS — R278 Other lack of coordination: Secondary | ICD-10-CM
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
## 2017-10-21 ENCOUNTER — Encounter: Payer: Self-pay | Admitting: Occupational Therapy

## 2017-10-21 ENCOUNTER — Ambulatory Visit: Payer: BC Managed Care – PPO | Admitting: Occupational Therapy

## 2017-10-21 DIAGNOSIS — M6281 Muscle weakness (generalized): Secondary | ICD-10-CM | POA: Diagnosis not present

## 2017-10-21 DIAGNOSIS — R278 Other lack of coordination: Secondary | ICD-10-CM

## 2017-10-21 DIAGNOSIS — R41844 Frontal lobe and executive function deficit: Secondary | ICD-10-CM

## 2017-10-21 DIAGNOSIS — M25622 Stiffness of left elbow, not elsewhere classified: Secondary | ICD-10-CM
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
## 2017-10-21 NOTE — Patient Instructions (Signed)
Reviewed instructions provided by MD for tendonitis - updated sheet with recommended repetitions and weight. Pt also issued yellow theraband.

## 2017-10-23 ENCOUNTER — Other Ambulatory Visit: Payer: Self-pay

## 2017-10-23 ENCOUNTER — Ambulatory Visit: Payer: BC Managed Care – PPO

## 2017-10-23 DIAGNOSIS — R41841 Cognitive communication deficit: Secondary | ICD-10-CM

## 2017-10-23 DIAGNOSIS — R1312 Dysphagia, oropharyngeal phase: Secondary | ICD-10-CM

## 2017-10-23 DIAGNOSIS — M6281 Muscle weakness (generalized): Secondary | ICD-10-CM | POA: Diagnosis not present

## 2017-10-23 NOTE — Patient Instructions (Signed)
Continue to do divided attention tasks at home.
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
## 2017-10-24 ENCOUNTER — Ambulatory Visit: Payer: BC Managed Care – PPO

## 2017-10-24 ENCOUNTER — Other Ambulatory Visit: Payer: Self-pay

## 2017-10-24 DIAGNOSIS — R1312 Dysphagia, oropharyngeal phase: Secondary | ICD-10-CM

## 2017-10-24 DIAGNOSIS — R41841 Cognitive communication deficit: Secondary | ICD-10-CM

## 2017-10-24 DIAGNOSIS — M6281 Muscle weakness (generalized): Secondary | ICD-10-CM | POA: Diagnosis not present
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
## 2017-11-04 ENCOUNTER — Other Ambulatory Visit: Payer: Self-pay

## 2017-11-04 ENCOUNTER — Ambulatory Visit: Payer: BC Managed Care – PPO

## 2017-11-04 DIAGNOSIS — M6281 Muscle weakness (generalized): Secondary | ICD-10-CM | POA: Diagnosis not present

## 2017-11-04 DIAGNOSIS — R41841 Cognitive communication deficit: Secondary | ICD-10-CM

## 2017-11-04 DIAGNOSIS — R1312 Dysphagia, oropharyngeal phase: Secondary | ICD-10-CM

## 2017-11-04 NOTE — Patient Instructions (Signed)
Signs of Aspiration Pneumonia   . Chest pain/tightness . Fever (can be low grade) . Cough  o With foul-smelling phlegm (sputum) o With sputum containing pus or blood o With greenish sputum . Fatigue  . Shortness of breath  . Wheezing   **IF YOU HAVE THESE SIGNS, CONTACT YOUR DOCTOR OR GO TO THE EMERGENCY DEPARTMENT OR URGENT CARE AS SOON AS POSSIBLE**      
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
## 2017-11-07 ENCOUNTER — Telehealth: Payer: Self-pay

## 2017-11-07 ENCOUNTER — Other Ambulatory Visit: Payer: Self-pay

## 2017-11-07 ENCOUNTER — Ambulatory Visit: Payer: BC Managed Care – PPO | Attending: Physical Medicine & Rehabilitation

## 2017-11-07 DIAGNOSIS — R41841 Cognitive communication deficit: Secondary | ICD-10-CM | POA: Insufficient documentation

## 2017-11-07 DIAGNOSIS — H8113 Benign paroxysmal vertigo, bilateral: Secondary | ICD-10-CM | POA: Diagnosis present

## 2017-11-07 DIAGNOSIS — R1312 Dysphagia, oropharyngeal phase: Secondary | ICD-10-CM | POA: Diagnosis present

## 2017-11-07 DIAGNOSIS — R2689 Other abnormalities of gait and mobility: Secondary | ICD-10-CM | POA: Diagnosis present

## 2017-11-07 NOTE — Telephone Encounter (Signed)
Normally I would have less/little concern due to not seeing overt s/s aspiration PNA currently, but: - [facility] made specific mention in her MBSS note that pt should receive instrumental swallow study prior to upgrading, and  - aspiration was silent  If you don't feel it's necessary then I defer to your judgement. I gave him information about overt s/s aspiration PNA.

## 2017-11-07 NOTE — Telephone Encounter (Signed)
Do you really feel that this is necessary? What gives you concern right now?

## 2017-11-07 NOTE — Telephone Encounter (Signed)
Note generated in error.

## 2017-11-07 NOTE — Patient Instructions (Addendum)
  Keep on doing divided attention tasks at home.  See about a follow up with that ear nose and throat doctor, your vertigo *may be* caused by the fullness in your ear.  You should be hearing about that swallow test by this time next week.

## 2017-11-07 NOTE — Telephone Encounter (Signed)
Dr. Riley KillSwartz- [facility] recommended a follow up modified (MBSS) in the future due to *silent aspiration*. I'm unsure at this time if he is still aspirating or otherwise. If you agree with this recommendation, please order via Epic. Thank you.  Verdie Mosherarl Bita Cartwright, SLP [facility] [phone]
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
## 2017-11-13 ENCOUNTER — Encounter: Payer: Self-pay | Admitting: Physical Medicine & Rehabilitation

## 2017-11-13 ENCOUNTER — Other Ambulatory Visit: Payer: Self-pay

## 2017-11-13 ENCOUNTER — Encounter
Payer: BC Managed Care – PPO | Attending: Physical Medicine & Rehabilitation | Admitting: Physical Medicine & Rehabilitation

## 2017-11-13 VITALS — BP 128/89 | HR 82

## 2017-11-13 DIAGNOSIS — S0993XA Unspecified injury of face, initial encounter: Secondary | ICD-10-CM | POA: Insufficient documentation

## 2017-11-13 DIAGNOSIS — H468 Other optic neuritis: Secondary | ICD-10-CM | POA: Diagnosis not present

## 2017-11-13 DIAGNOSIS — F068 Other specified mental disorders due to known physiological condition: Secondary | ICD-10-CM | POA: Diagnosis not present

## 2017-11-13 DIAGNOSIS — R51 Headache: Secondary | ICD-10-CM | POA: Diagnosis not present

## 2017-11-13 DIAGNOSIS — S069X0S Unspecified intracranial injury without loss of consciousness, sequela: Secondary | ICD-10-CM

## 2017-11-13 DIAGNOSIS — M7522 Bicipital tendinitis, left shoulder: Secondary | ICD-10-CM | POA: Insufficient documentation

## 2017-11-13 DIAGNOSIS — H8110 Benign paroxysmal vertigo, unspecified ear: Secondary | ICD-10-CM | POA: Diagnosis not present

## 2017-11-13 DIAGNOSIS — Z87820 Personal history of traumatic brain injury: Secondary | ICD-10-CM | POA: Diagnosis not present

## 2017-11-13 DIAGNOSIS — S6992XA Unspecified injury of left wrist, hand and finger(s), initial encounter: Secondary | ICD-10-CM | POA: Insufficient documentation

## 2017-11-13 DIAGNOSIS — M25512 Pain in left shoulder: Secondary | ICD-10-CM | POA: Insufficient documentation

## 2017-11-13 DIAGNOSIS — R42 Dizziness and giddiness: Secondary | ICD-10-CM | POA: Diagnosis not present

## 2017-11-13 DIAGNOSIS — R4189 Other symptoms and signs involving cognitive functions and awareness: Secondary | ICD-10-CM
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
## 2017-11-13 NOTE — Patient Instructions (Addendum)
PLEASE FEEL FREE TO CALL OUR OFFICE WITH ANY PROBLEMS OR QUESTIONS [phone])     MELOXICAM: CHECK WITH SURGEON ABOUT HOLDING THIS BEFORE YOUR WRIST SURGERY

## 2017-11-26 ENCOUNTER — Other Ambulatory Visit: Payer: Self-pay

## 2017-11-26 ENCOUNTER — Ambulatory Visit: Payer: BC Managed Care – PPO

## 2017-11-26 DIAGNOSIS — H8113 Benign paroxysmal vertigo, bilateral: Secondary | ICD-10-CM

## 2017-11-26 DIAGNOSIS — R2689 Other abnormalities of gait and mobility: Secondary | ICD-10-CM

## 2017-11-26 DIAGNOSIS — R41841 Cognitive communication deficit: Secondary | ICD-10-CM | POA: Diagnosis not present
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
## 2017-12-06 ENCOUNTER — Ambulatory Visit: Payer: BC Managed Care – PPO | Attending: Physical Medicine & Rehabilitation

## 2017-12-06 DIAGNOSIS — H8113 Benign paroxysmal vertigo, bilateral: Secondary | ICD-10-CM | POA: Insufficient documentation

## 2017-12-06 DIAGNOSIS — R278 Other lack of coordination: Secondary | ICD-10-CM | POA: Diagnosis present

## 2017-12-06 DIAGNOSIS — R2681 Unsteadiness on feet: Secondary | ICD-10-CM | POA: Diagnosis present

## 2017-12-06 DIAGNOSIS — R41841 Cognitive communication deficit: Secondary | ICD-10-CM | POA: Insufficient documentation

## 2017-12-06 DIAGNOSIS — M6281 Muscle weakness (generalized): Secondary | ICD-10-CM | POA: Diagnosis present

## 2017-12-06 DIAGNOSIS — R41844 Frontal lobe and executive function deficit: Secondary | ICD-10-CM | POA: Diagnosis present

## 2017-12-06 DIAGNOSIS — R2689 Other abnormalities of gait and mobility: Secondary | ICD-10-CM

## 2017-12-06 DIAGNOSIS — M25622 Stiffness of left elbow, not elsewhere classified: Secondary | ICD-10-CM | POA: Diagnosis present
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
## 2017-12-13 ENCOUNTER — Ambulatory Visit: Payer: BC Managed Care – PPO

## 2017-12-13 DIAGNOSIS — H8113 Benign paroxysmal vertigo, bilateral: Secondary | ICD-10-CM | POA: Diagnosis not present
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
## 2017-12-24 ENCOUNTER — Ambulatory Visit: Payer: BC Managed Care – PPO | Admitting: Occupational Therapy

## 2017-12-26 ENCOUNTER — Encounter: Payer: Self-pay | Admitting: Occupational Therapy

## 2017-12-26 ENCOUNTER — Ambulatory Visit: Payer: BC Managed Care – PPO | Admitting: Occupational Therapy

## 2017-12-26 DIAGNOSIS — M6281 Muscle weakness (generalized): Secondary | ICD-10-CM

## 2017-12-26 DIAGNOSIS — M25622 Stiffness of left elbow, not elsewhere classified: Secondary | ICD-10-CM

## 2017-12-26 DIAGNOSIS — R41844 Frontal lobe and executive function deficit: Secondary | ICD-10-CM

## 2017-12-26 DIAGNOSIS — H8113 Benign paroxysmal vertigo, bilateral: Secondary | ICD-10-CM | POA: Diagnosis not present

## 2017-12-26 DIAGNOSIS — R278 Other lack of coordination: Secondary | ICD-10-CM

## 2017-12-26 DIAGNOSIS — R2681 Unsteadiness on feet: Secondary | ICD-10-CM
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
## 2017-12-26 NOTE — Patient Instructions (Signed)
1. Grip Strengthening (Resistive Putty)  Use the RED putty.     Squeeze putty using thumb and all fingers. Repeat _20___ times. Do __2__ sessions per day.   2. Roll putty into tube on table and pinch between thumb, index and middle fingers.  Do 5.  Do 2 times per day.     Wrist exercises:  The two motions we will focus on are wrist flexion and wrist extension.    1. For wrist flexion:  Sit with your forearm face down and supported on a table. Allow your hand to hang over the edge of the table.  Bend your hand down as far you can go, then use your right hand to push it further (within your pain tolerance). HOLD FOR A SLOW COUNT OF 10 then relax.  Do 10, rest then do 10 more.  2. For wrist extension:  Do the PRAYER position.  Keep your palms and the heels of your hand as close together as possible.  Raise your elbows up as far as you can, keeping your hands together.  HOLD FOR A SLOW COUNT OF 10.  Relax.  Do 10, rest then do 10 more.   We will be adding more exercises the next time I see you.      Copyright  VHI. All rights reserved.

## 2017-12-27 ENCOUNTER — Ambulatory Visit: Payer: BC Managed Care – PPO

## 2017-12-30 ENCOUNTER — Ambulatory Visit: Payer: BC Managed Care – PPO | Admitting: Occupational Therapy

## 2017-12-30 DIAGNOSIS — H8113 Benign paroxysmal vertigo, bilateral: Secondary | ICD-10-CM | POA: Diagnosis not present

## 2017-12-30 DIAGNOSIS — R278 Other lack of coordination: Secondary | ICD-10-CM

## 2017-12-30 DIAGNOSIS — R41844 Frontal lobe and executive function deficit: Secondary | ICD-10-CM

## 2017-12-30 DIAGNOSIS — M6281 Muscle weakness (generalized): Secondary | ICD-10-CM

## 2017-12-30 DIAGNOSIS — M25622 Stiffness of left elbow, not elsewhere classified: Secondary | ICD-10-CM
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
## 2017-12-30 NOTE — Patient Instructions (Signed)
AROM: Wrist Extension   .  With _left ___ palm down, bend wrist up hold 2 lbs weight, stop if increased pain Repeat __15__ times per set.  Do __1-2_ sessions per day.

## 2018-01-01 ENCOUNTER — Ambulatory Visit: Payer: BC Managed Care – PPO | Admitting: *Deleted

## 2018-01-01 ENCOUNTER — Encounter: Payer: Self-pay | Admitting: *Deleted

## 2018-01-01 DIAGNOSIS — H8113 Benign paroxysmal vertigo, bilateral: Secondary | ICD-10-CM | POA: Diagnosis not present

## 2018-01-01 DIAGNOSIS — M25622 Stiffness of left elbow, not elsewhere classified: Secondary | ICD-10-CM

## 2018-01-01 DIAGNOSIS — R41844 Frontal lobe and executive function deficit: Secondary | ICD-10-CM

## 2018-01-01 DIAGNOSIS — M6281 Muscle weakness (generalized): Secondary | ICD-10-CM

## 2018-01-01 DIAGNOSIS — R278 Other lack of coordination: Secondary | ICD-10-CM

## 2018-01-01 DIAGNOSIS — R41841 Cognitive communication deficit: Secondary | ICD-10-CM
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
## 2018-01-07 ENCOUNTER — Ambulatory Visit: Payer: BC Managed Care – PPO | Attending: Physical Medicine & Rehabilitation | Admitting: Occupational Therapy

## 2018-01-07 ENCOUNTER — Encounter: Payer: Self-pay | Admitting: Occupational Therapy

## 2018-01-07 DIAGNOSIS — R41844 Frontal lobe and executive function deficit: Secondary | ICD-10-CM | POA: Insufficient documentation

## 2018-01-07 DIAGNOSIS — R278 Other lack of coordination: Secondary | ICD-10-CM | POA: Diagnosis present

## 2018-01-07 DIAGNOSIS — M25622 Stiffness of left elbow, not elsewhere classified: Secondary | ICD-10-CM | POA: Diagnosis present

## 2018-01-07 DIAGNOSIS — R2681 Unsteadiness on feet: Secondary | ICD-10-CM | POA: Insufficient documentation

## 2018-01-07 DIAGNOSIS — M6281 Muscle weakness (generalized): Secondary | ICD-10-CM | POA: Insufficient documentation
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
## 2018-01-08 ENCOUNTER — Ambulatory Visit: Payer: BC Managed Care – PPO | Admitting: Occupational Therapy

## 2018-01-08 ENCOUNTER — Encounter
Payer: BC Managed Care – PPO | Attending: Physical Medicine & Rehabilitation | Admitting: Physical Medicine & Rehabilitation

## 2018-01-08 ENCOUNTER — Encounter: Payer: Self-pay | Admitting: Physical Medicine & Rehabilitation

## 2018-01-08 DIAGNOSIS — R51 Headache: Secondary | ICD-10-CM | POA: Diagnosis not present

## 2018-01-08 DIAGNOSIS — M25512 Pain in left shoulder: Secondary | ICD-10-CM | POA: Diagnosis not present

## 2018-01-08 DIAGNOSIS — R278 Other lack of coordination: Secondary | ICD-10-CM

## 2018-01-08 DIAGNOSIS — S0993XA Unspecified injury of face, initial encounter: Secondary | ICD-10-CM | POA: Insufficient documentation

## 2018-01-08 DIAGNOSIS — Z87820 Personal history of traumatic brain injury: Secondary | ICD-10-CM | POA: Insufficient documentation

## 2018-01-08 DIAGNOSIS — S0432XD Injury of trigeminal nerve, left side, subsequent encounter: Secondary | ICD-10-CM

## 2018-01-08 DIAGNOSIS — S6992XA Unspecified injury of left wrist, hand and finger(s), initial encounter: Secondary | ICD-10-CM | POA: Insufficient documentation

## 2018-01-08 DIAGNOSIS — M6281 Muscle weakness (generalized): Secondary | ICD-10-CM | POA: Diagnosis not present

## 2018-01-08 DIAGNOSIS — M7522 Bicipital tendinitis, left shoulder: Secondary | ICD-10-CM | POA: Insufficient documentation

## 2018-01-08 DIAGNOSIS — R42 Dizziness and giddiness: Secondary | ICD-10-CM | POA: Diagnosis not present

## 2018-01-08 DIAGNOSIS — R41844 Frontal lobe and executive function deficit: Secondary | ICD-10-CM

## 2018-01-08 DIAGNOSIS — H468 Other optic neuritis: Secondary | ICD-10-CM | POA: Diagnosis not present

## 2018-01-08 MED ORDER — CARBAMAZEPINE 200 MG PO TABS: 200.0000 mg | ORAL_TABLET | Freq: Four times a day (QID) | ORAL | 2 refills | Status: DC
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
## 2018-01-08 NOTE — Patient Instructions (Addendum)
Neil-Med or Neti Pot for your nose  Robitussin DM to help loose phlegm/mucus.   If your sinuses don't clear up after another week to ten days, then you may need to see your primary doctor.     TEGRETOL:  200MG  TWICE DAILY FOR 3 DAYS,  THEN 200MG  THREE X DAILY FOR 3 DAYS,  THEN 200MG  4X DAILY   GIVE THAT TWO WEEKS. IF IT'S NOT HELPING, CALL MY OFFICE.

## 2018-01-13 ENCOUNTER — Ambulatory Visit: Payer: BC Managed Care – PPO | Admitting: Occupational Therapy

## 2018-01-13 ENCOUNTER — Encounter: Payer: Self-pay | Admitting: Occupational Therapy

## 2018-01-13 DIAGNOSIS — M25622 Stiffness of left elbow, not elsewhere classified: Secondary | ICD-10-CM

## 2018-01-13 DIAGNOSIS — M6281 Muscle weakness (generalized): Secondary | ICD-10-CM | POA: Diagnosis not present

## 2018-01-13 DIAGNOSIS — R2681 Unsteadiness on feet: Secondary | ICD-10-CM

## 2018-01-13 DIAGNOSIS — R278 Other lack of coordination: Secondary | ICD-10-CM

## 2018-01-13 DIAGNOSIS — R41844 Frontal lobe and executive function deficit: Secondary | ICD-10-CM

## 2018-01-13 NOTE — Therapy (Signed)
[facility] [address] [place], [place], [address] Phone: [phone]   Fax:  [phone]  Patient Details  Name: Austin Zavala MRN: [id number] Date of Birth: 27-Sep-1966 Referring Provider:  No ref. provider found  Encounter Date: 01/13/2018  SPEECH THERAPY DISCHARGE SUMMARY  Visits from Start of Care: 12  Current functional level related to goals / functional outcomes: Pt's long term goals were all achieved.  Pt's last therapy note reads: Attention skllls cont to appear to be resolving. Pt again with appearedly WNL divided atteniton when dual-tasking with simple-mod complex and mod complex cognitive linguistic tasks. See "skilled intervention" for details. SLP to leave pt chart open 30 days on the small chance pt would need to return for dysphagia tx following modified barium swallow exam. No overt s/s aspiration PNA to date.  Pt reports he has cont'd regimented about performing divided attnetion tasks at home.     Remaining deficits: None as detected in final ST visit.   Education / Equipment: Compensations for cognitive linguistic deficits.  Plan: Patient agrees to discharge.  Patient goals were met. Patient is being discharged due to being pleased with the current functional level.  ?????      [place] ,MS, CCC-SLP  01/13/2018, 9:25 AM  [facility] [address] [place], [place], [address] Phone: [phone]   Fax:  [phone]
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
## 2018-01-14 ENCOUNTER — Encounter: Payer: BC Managed Care – PPO | Admitting: Occupational Therapy

## 2018-01-14 NOTE — Therapy (Signed)
[facility] [address] [place], [place], [address] Phone: [phone]   Fax:  [phone]  Patient Details  Name: Austin Zavala MRN: [id number] Date of Birth: [birth year] Referring Provider:  No ref. provider found  Encounter Date: 01/14/2018   PHYSICAL THERAPY DISCHARGE SUMMARY  Visits from Start of Care: 3  Current functional level related to goals / functional outcomes: PT Long Term Goals - 12/06/17 1207      PT LONG TERM GOAL #1   Title  Pt will report 0/10 during all positional testing to improve QOL and safety during ADLs. TARGET DATE FOR ALL LTGS: 12/24/17    Status  Met: dizziness resolved      PT LONG TERM GOAL #2   Title  Perform FGA and write goal as indicated.     Baseline  30/30 on 12/06/17    Status  Deferred      PT LONG TERM GOAL #3   Title  Pt will report no dizziness during all bed mobility and txfs in order to safely perform functional mobility.     Status Met: dizziness resolved        Remaining deficits: None, PT held pt for 2 weeks in case dizziness returned. Pt called and reported dizziness is resolved, therefore, pt discharged.    Education / Equipment: Education on BPPV and the importance of safety if dizziness is present.  Plan: Patient agrees to discharge.  Patient goals were met. Patient is being discharged due to meeting the stated rehab goals.  ?????    Geoffry Paradise, PT,DPT 01/14/18 8:24 AM Phone: [phone] Fax: [phone]       Becky Berberian L 01/14/2018, 8:22 AM  [facility] [address] [place], [place], [address] Phone: [phone]   Fax:  [phone]

## 2018-01-21 ENCOUNTER — Encounter: Payer: BC Managed Care – PPO | Admitting: Occupational Therapy

## 2018-01-22 ENCOUNTER — Encounter: Payer: BC Managed Care – PPO | Admitting: Occupational Therapy

## 2018-04-09 ENCOUNTER — Encounter: Payer: BC Managed Care – PPO | Admitting: Physical Medicine & Rehabilitation

## 2018-07-14 ENCOUNTER — Encounter (HOSPITAL_COMMUNITY): Payer: Self-pay

## 2018-07-14 ENCOUNTER — Emergency Department (HOSPITAL_COMMUNITY)
Admission: EM | Admit: 2018-07-14 | Discharge: 2018-07-15 | Disposition: A | Discharge status: Home / Self Care | Payer: BC Managed Care – PPO | Attending: Emergency Medicine | Admitting: Emergency Medicine

## 2018-07-14 ENCOUNTER — Other Ambulatory Visit: Payer: Self-pay

## 2018-07-14 DIAGNOSIS — S069X9S Unspecified intracranial injury with loss of consciousness of unspecified duration, sequela: Secondary | ICD-10-CM | POA: Insufficient documentation

## 2018-07-14 DIAGNOSIS — R45851 Suicidal ideations: Secondary | ICD-10-CM | POA: Diagnosis not present

## 2018-07-14 DIAGNOSIS — X58XXXA Exposure to other specified factors, initial encounter: Secondary | ICD-10-CM | POA: Diagnosis not present

## 2018-07-14 DIAGNOSIS — F39 Unspecified mood [affective] disorder: Secondary | ICD-10-CM | POA: Diagnosis not present

## 2018-07-14 DIAGNOSIS — F322 Major depressive disorder, single episode, severe without psychotic features: Secondary | ICD-10-CM | POA: Diagnosis not present

## 2018-07-14 DIAGNOSIS — S0990XS Unspecified injury of head, sequela: Secondary | ICD-10-CM | POA: Diagnosis present

## 2018-07-14 HISTORY — DX: Unspecified intracranial injury with loss of consciousness of unspecified duration, initial encounter: S06.9X9A

## 2018-07-14 HISTORY — DX: Unspecified hearing loss, unspecified ear: H91.90

## 2018-07-14 HISTORY — DX: Unspecified intracranial injury with loss of consciousness status unknown, initial encounter: S06.9XAA

## 2018-07-14 HISTORY — DX: Other injury of unspecified body region, initial encounter: T14.8XXA

## 2018-07-14 HISTORY — DX: Unspecified visual loss: H54.7

## 2018-07-14 LAB — COMPREHENSIVE METABOLIC PANEL
ALT: 17 U/L (ref 0–44)
ANION GAP: 11 (ref 5–15)
AST: 19 U/L (ref 15–41)
Albumin: 4.6 g/dL (ref 3.5–5.0)
Alkaline Phosphatase: 80 U/L (ref 38–126)
BUN: 10 mg/dL (ref 6–20)
CHLORIDE: 107 mmol/L (ref 98–111)
CO2: 25 mmol/L (ref 22–32)
Calcium: 9.7 mg/dL (ref 8.9–10.3)
Creatinine, Ser: 1 mg/dL (ref 0.61–1.24)
Glucose, Bld: 98 mg/dL (ref 70–99)
POTASSIUM: 3.6 mmol/L (ref 3.5–5.1)
Sodium: 143 mmol/L (ref 135–145)
Total Bilirubin: 0.9 mg/dL (ref 0.3–1.2)
Total Protein: 8 g/dL (ref 6.5–8.1)

## 2018-07-14 LAB — RAPID URINE DRUG SCREEN, HOSP PERFORMED
AMPHETAMINES: NOT DETECTED
Barbiturates: NOT DETECTED
Benzodiazepines: NOT DETECTED
COCAINE: NOT DETECTED
OPIATES: NOT DETECTED
Tetrahydrocannabinol: NOT DETECTED

## 2018-07-14 LAB — CBC
HCT: 43.6 % (ref 39.0–52.0)
Hemoglobin: 14.5 g/dL (ref 13.0–17.0)
MCH: 29.1 pg (ref 26.0–34.0)
MCHC: 33.3 g/dL (ref 30.0–36.0)
MCV: 87.4 fL (ref 78.0–100.0)
PLATELETS: 239 10*3/uL (ref 150–400)
RBC: 4.99 MIL/uL (ref 4.22–5.81)
RDW: 13.6 % (ref 11.5–15.5)
WBC: 11.2 10*3/uL — AB (ref 4.0–10.5)

## 2018-07-14 LAB — ACETAMINOPHEN LEVEL

## 2018-07-14 LAB — SALICYLATE LEVEL

## 2018-07-14 LAB — ETHANOL

## 2018-07-14 MED ORDER — TRAZODONE HCL 50 MG PO TABS
50.0000 mg | ORAL_TABLET | Freq: Every evening | ORAL | Status: DC | PRN
  Administered 2018-07-14: 50 mg via ORAL
  Filled 2018-07-14: qty 1
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
## 2018-07-14 NOTE — ED Notes (Signed)
Pt gave all his belongings to his wife before she left
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
## 2018-07-15 DIAGNOSIS — F39 Unspecified mood [affective] disorder: Secondary | ICD-10-CM

## 2018-07-15 MED ORDER — DIVALPROEX SODIUM 250 MG PO DR TAB
250.0000 mg | DELAYED_RELEASE_TABLET | Freq: Two times a day (BID) | ORAL | Status: DC
  Administered 2018-07-15: 250 mg via ORAL
  Filled 2018-07-15: qty 1

## 2018-07-15 MED ORDER — HYDROXYZINE HCL 25 MG PO TABS
25.0000 mg | ORAL_TABLET | Freq: Once | ORAL | Status: AC
  Administered 2018-07-15: 25 mg via ORAL
  Filled 2018-07-15: qty 1

## 2018-07-15 MED ORDER — DIVALPROEX SODIUM 250 MG PO DR TAB: 250.0000 mg | DELAYED_RELEASE_TABLET | Freq: Two times a day (BID) | ORAL | 0 refills | Status: DC

## 2018-07-15 MED ORDER — HYDROXYZINE HCL 25 MG PO TABS: 25.0000 mg | ORAL_TABLET | Freq: Three times a day (TID) | ORAL | 0 refills | Status: DC | PRN

## 2018-07-15 NOTE — Discharge Instructions (Signed)
For your behavioral health needs, you are advised to follow up with the Partial Hospitalization Program (PHP) at the [facility] [place].  This program meets Monday - Thursday from 9:00 am - 2:00 pm, and Friday from 8:00 am - 1:00 pm.  You are scheduled for an intake appointment on Wednesday, July 16, 2018 at 2:30 pm.  Please plan to be there 15 minutes early with paperwork provided by ED staff filled out.  If you have any questions, contact Donia Guiles or Milana Na at the phone number indicated below:       [employer] at [facility]. [employer]. [address]      [place], [place] [address]      Contact person: Donia Guiles or Milana Na      [phone]

## 2018-07-15 NOTE — Consult Note (Addendum)
[facility] Psych ED Discharge  07/15/2018 11:30 AM Austin Zavala  MRN:  [id number] Principal Problem: Mood disorder [facility]) Discharge Diagnoses:  Patient Active Problem List   Diagnosis Date Noted  . History of traumatic brain injury [Z87.820] 10/08/2017  . Cognitive deficit as late effect of traumatic brain injury (HCC) [F06.8, S06.9X0S] 10/08/2017  . Left shoulder pain [M25.512] 10/08/2017    Subjective:  Austin Zavala reports feeling depressed since sustaining a TBI 1.5 years ago. He denies SI, HI or AVH. He does admit to endorsing SI when he is angry. He denies any intention to harm self or a history of suicide attempts. He reports becoming upset secondary to arguments with his wife. He reports positive coping skills when he becomes upset at work. He will take a short drive in his truck and return to work with improvement in his mood. He reports damaging his belongings at times when he is at home though. He denies physical aggression towards others. He denies problems with sleep or appetite. He feels like he would benefit from a support group. He has seen a therapist through work recently and appears to have found it beneficial.   Total Time spent with patient: 45 minutes  Past Psychiatric History: Depression  Past Medical History:  Past Medical History:  Diagnosis Date  . Hearing loss   . Nerve damage   . TBI (traumatic brain injury) (HCC)   . Vision loss    Past Surgical History:  Procedure Laterality Date  . APPENDECTOMY    . FRACTURE SURGERY    . JOINT REPLACEMENT    . VASECTOMY     Family History: History reviewed. No pertinent family history. Family Psychiatric  History: Denies  Social History:  Social History   Substance and Sexual Activity  Alcohol Use Yes  . Alcohol/week: 1.0 - 2.0 standard drinks  . Types: 1 - 2 Cans of beer per week   Comment: occasionally    Social History   Substance and Sexual Activity  Drug Use No   Social History   Socioeconomic History  .  Marital status: Single    Spouse name: Not on file  . Number of children: Not on file  . Years of education: Not on file  . Highest education level: Not on file  Occupational History  . Not on file  Social Needs  . Financial resource strain: Not on file  . Food insecurity:    Worry: Not on file    Inability: Not on file  . Transportation needs:    Medical: Not on file    Non-medical: Not on file  Tobacco Use  . Smoking status: Never Smoker  . Smokeless tobacco: Current User    Types: Snuff  . Tobacco comment: Dip  Substance and Sexual Activity  . Alcohol use: Yes    Alcohol/week: 1.0 - 2.0 standard drinks    Types: 1 - 2 Cans of beer per week    Comment: occasionally  . Drug use: No  . Sexual activity: Yes    Birth control/protection: Surgical  Lifestyle  . Physical activity:    Days per week: Not on file    Minutes per session: Not on file  . Stress: Not on file  Relationships  . Social connections:    Talks on phone: Not on file    Gets together: Not on file    Attends religious service: Not on file    Active member of club or organization: Not on file  Attends meetings of clubs or organizations: Not on file    Relationship status: Not on file  Other Topics Concern  . Not on file  Social History Narrative  . Not on file    Has this patient used any form of tobacco in the last 30 days? (Cigarettes, Smokeless Tobacco, Cigars, and/or Pipes) Prescription not provided because: N/A  Current Medications: Current Facility-Administered Medications  Medication Dose Route Frequency Provider Last Rate Last Dose  . traZODone (DESYREL) tablet 50 mg  50 mg Oral QHS PRN Nira Conn A, NP   50 mg at 07/14/18 2316   Current Outpatient Medications  Medication Sig Dispense Refill  . carbamazepine (TEGRETOL) 200 MG tablet Take 1 tablet (200 mg total) by mouth 4 (four) times daily. (Patient not taking: Reported on 07/14/2018) 120 tablet 2   PTA Medications:  (Not in a  hospital admission)  Musculoskeletal: Strength & Muscle Tone: within normal limits Gait & Station: UTA since patient is lying in bed. Patient leans: N/A  Psychiatric Specialty Exam: Physical Exam  Nursing note and vitals reviewed. Constitutional: He is oriented to person, place, and time. He appears well-developed and well-nourished.  HENT:  Head: Normocephalic and atraumatic.  Neck: Normal range of motion.  Respiratory: Effort normal.  Musculoskeletal: Normal range of motion.  Neurological: He is alert and oriented to person, place, and time.  Psychiatric: He has a normal mood and affect. His speech is normal and behavior is normal. Judgment and thought content normal. Cognition and memory are normal.    Review of Systems  Psychiatric/Behavioral: Positive for depression. Negative for hallucinations, substance abuse and suicidal ideas. The patient does not have insomnia.   All other systems reviewed and are negative.   Blood pressure (!) 142/102, pulse 84, temperature 98.4 F (36.9 C), temperature source Oral, resp. rate 18, height 5\' 9"  (1.753 m), weight 108.9 kg, SpO2 99 %.Body mass index is 35.44 kg/m.  General Appearance: Fairly Groomed, middle aged, Caucasian male, wearing paper hospital scrubs and sitting in bed. NAD.   Eye Contact:  Good  Speech:  Clear and Coherent and Normal Rate  Volume:  Normal  Mood:  Depressed  Affect:  Constricted  Thought Process:  Goal Directed, Linear and Descriptions of Associations: Intact  Orientation:  Full (Time, Place, and Person)  Thought Content:  Logical  Suicidal Thoughts:  No  Homicidal Thoughts:  No  Memory:  Immediate;   Fair Recent;   Good Remote;   Good  Judgement:  Fair  Insight:  Fair  Psychomotor Activity:  Normal  Concentration:  Concentration: Good and Attention Span: Good  Recall:  Fair  Fund of Knowledge:  Good  Language:  Good  Akathisia:  No  Handed:  Right  AIMS (if indicated):   N/A  Assets:  Communication  Skills Desire for Improvement Housing Intimacy Social Support  ADL's:  Intact  Cognition:  WNL  Sleep:   Okay   Assessment:  Austin Zavala is a 52 y.o. male who was admitted with agitation and SI. He is calm and appropriate on interview. He denies SI, HI or AVH although he admits to depressed mood since his TBI 1.5 years ago. He does not warrant inpatient psychiatric hospitalization although he would benefit from outpatient mental health resources.   Demographic Factors:  Male and Caucasian  Loss Factors: NA  Historical Factors: Impulsivity  Risk Reduction Factors:   Sense of responsibility to family, Employed, Living with another person, especially a relative, Positive social support and  Positive coping skills or problem solving skills  Continued Clinical Symptoms:  Depression  Cognitive Features That Contribute To Risk:  None    Suicide Risk:  Minimal: No identifiable suicidal ideation.  Patients presenting with no risk factors but with morbid ruminations; may be classified as minimal risk based on the severity of the depressive symptoms    Plan Of Care/Follow-up recommendations:  -Start Depakote 250 mg BID for mood stabilization.  -Will provide patient with outpatient resources including PHP information.    Disposition: Discharge home Cherly Beach, DO 07/15/2018, 11:30 AM

## 2018-07-15 NOTE — ED Notes (Signed)
Patient denies pain and is resting comfortably.  

## 2018-07-15 NOTE — BH Assessment (Signed)
[facility] Assessment Progress Note  Per Juanetta Beets, DO, this pt does not require psychiatric hospitalization at this time.  Pt is to be discharged from [facility] with recommendation to follow up with the Partial Hospitalization Program at the [facility] [place].  Pt is scheduled for an intake appointment on Wednesday, 07/16/2018 at 14:30; he is to be advised to arrive 15 minutes early with completed intake paperwork provided by [facility] staff.  This has been included in pt's discharge instructions.  Pt's nurse has been notified.  Doylene Canning, MA Triage Specialist [phone]

## 2018-07-16 ENCOUNTER — Other Ambulatory Visit (HOSPITAL_COMMUNITY): Payer: BC Managed Care – PPO | Attending: Psychiatry | Admitting: Licensed Clinical Social Worker

## 2018-07-16 DIAGNOSIS — F329 Major depressive disorder, single episode, unspecified: Secondary | ICD-10-CM | POA: Diagnosis present

## 2018-07-16 DIAGNOSIS — F0630 Mood disorder due to known physiological condition, unspecified: Secondary | ICD-10-CM
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
## 2018-08-01 ENCOUNTER — Encounter: Payer: Self-pay | Admitting: Family Medicine

## 2018-08-01 ENCOUNTER — Ambulatory Visit: Payer: BC Managed Care – PPO | Admitting: Family Medicine

## 2018-08-01 ENCOUNTER — Other Ambulatory Visit: Payer: Self-pay

## 2018-08-01 VITALS — BP 121/84 | HR 98 | Temp 98.5°F | Ht 69.0 in | Wt 237.6 lb

## 2018-08-01 DIAGNOSIS — R454 Irritability and anger: Secondary | ICD-10-CM | POA: Diagnosis not present

## 2018-08-01 DIAGNOSIS — Z87820 Personal history of traumatic brain injury: Secondary | ICD-10-CM | POA: Diagnosis not present

## 2018-08-01 MED ORDER — DIVALPROEX SODIUM 250 MG PO DR TAB: 250.0000 mg | DELAYED_RELEASE_TABLET | Freq: Two times a day (BID) | ORAL | 0 refills | Status: AC

## 2018-08-01 MED ORDER — HYDROXYZINE HCL 25 MG PO TABS: 12.5000 mg | ORAL_TABLET | Freq: Three times a day (TID) | ORAL | 0 refills | Status: AC | PRN
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
## 2018-08-01 NOTE — Patient Instructions (Addendum)
  I am glad to hear the Depakote has been helpful.  Continue same dose for now, and hydroxyzine if needed.  You can always take a half of the hydroxyzine for more mild symptoms.  I referred you to neurology to discuss further testing or adjustments in medication for your symptoms that are likely due to traumatic brain injury.  Follow-up with me in 1 month to see how things are going and we can review other components of primary care needs at that time.  I am happy to see you sooner if needed and let me know if refills are needed prior to seeing neurology.    Thank you for coming in today.   If you have lab work done today you will be contacted with your lab results within the next 2 weeks.  If you have not heard from [place] then please contact us. The fastest way to get your results is to register for My Chart.   IF you received an x-ray today, you will receive an invoice from [facility]. Please contact [facility] at [phone] with questions or concerns regarding your invoice.   IF you received labwork today, you will receive an invoice from [place]. Please contact [facility] at [phone] with questions or concerns regarding your invoice.   Our billing staff will not be able to assist you with questions regarding bills from these companies.  You will be contacted with the lab results as soon as they are available. The fastest way to get your results is to activate your My Chart account. Instructions are located on the last page of this paperwork. If you have not heard from [place] regarding the results in 2 weeks, please contact this office.

## 2018-08-01 NOTE — Progress Notes (Signed)
Letter sent to pt

## 2018-08-11 ENCOUNTER — Telehealth: Payer: Self-pay | Admitting: Family Medicine

## 2018-08-11 NOTE — Telephone Encounter (Signed)
Just spoke with pt's significant other cynthia welborne and she stated that pt has already seen behavorial health and they referred them to Dr. Neva Seat. I am going to refer pt to another neurology office and go from there. I already advised pt's significant other of this and she is fine with this.

## 2018-08-11 NOTE — Telephone Encounter (Signed)
This message came from [place] [facility].Marland Kitchen  "Per Dr Karel Jarvis there is not much we can offer the patient and she recommends a referral to behavioral health.."  Please advise

## 2018-09-05 ENCOUNTER — Ambulatory Visit: Payer: BC Managed Care – PPO | Admitting: Family Medicine
